# Patient Record
Sex: Female | Born: 1970 | Race: White | Hispanic: No | Smoking: Current every day smoker
Health system: Southern US, Community
[De-identification: ages and names within clinical notes are randomized; demographics above are authoritative.]

## PROBLEM LIST (undated history)

## (undated) DIAGNOSIS — M5136 Other intervertebral disc degeneration, lumbar region: Secondary | ICD-10-CM

## (undated) DIAGNOSIS — IMO0002 Reserved for concepts with insufficient information to code with codable children: Secondary | ICD-10-CM

## (undated) DIAGNOSIS — K859 Acute pancreatitis without necrosis or infection, unspecified: Secondary | ICD-10-CM

## (undated) DIAGNOSIS — G43909 Migraine, unspecified, not intractable, without status migrainosus: Secondary | ICD-10-CM

## (undated) DIAGNOSIS — F319 Bipolar disorder, unspecified: Secondary | ICD-10-CM

## (undated) DIAGNOSIS — J45909 Unspecified asthma, uncomplicated: Secondary | ICD-10-CM

## (undated) DIAGNOSIS — M51369 Other intervertebral disc degeneration, lumbar region without mention of lumbar back pain or lower extremity pain: Secondary | ICD-10-CM

## (undated) DIAGNOSIS — I10 Essential (primary) hypertension: Secondary | ICD-10-CM

## (undated) HISTORY — PX: TUBAL LIGATION: SHX77

## (undated) HISTORY — PX: COLONOSCOPY: SHX174

## (undated) HISTORY — DX: Other intervertebral disc degeneration, lumbar region: M51.36

---

## 1999-07-28 ENCOUNTER — Emergency Department (HOSPITAL_COMMUNITY): Admission: EM | Admit: 1999-07-28 | Discharge: 1999-07-28 | Payer: Self-pay | Admitting: Emergency Medicine

## 2000-10-14 ENCOUNTER — Emergency Department (HOSPITAL_COMMUNITY): Admission: EM | Admit: 2000-10-14 | Discharge: 2000-10-14 | Payer: Self-pay | Admitting: Emergency Medicine

## 2000-10-14 ENCOUNTER — Encounter: Payer: Self-pay | Admitting: Emergency Medicine

## 2002-02-24 ENCOUNTER — Encounter (INDEPENDENT_AMBULATORY_CARE_PROVIDER_SITE_OTHER): Payer: Self-pay | Admitting: *Deleted

## 2002-02-24 ENCOUNTER — Encounter: Admission: RE | Admit: 2002-02-24 | Discharge: 2002-02-24 | Payer: Self-pay | Admitting: *Deleted

## 2002-02-24 ENCOUNTER — Other Ambulatory Visit: Admission: RE | Admit: 2002-02-24 | Discharge: 2002-02-24 | Payer: Self-pay | Admitting: *Deleted

## 2002-12-21 ENCOUNTER — Other Ambulatory Visit: Admission: RE | Admit: 2002-12-21 | Discharge: 2002-12-21 | Payer: Self-pay | Admitting: Obstetrics and Gynecology

## 2003-02-27 ENCOUNTER — Encounter: Admission: RE | Admit: 2003-02-27 | Discharge: 2003-02-27 | Payer: Self-pay | Admitting: Obstetrics and Gynecology

## 2003-02-28 ENCOUNTER — Encounter: Payer: Self-pay | Admitting: Obstetrics and Gynecology

## 2003-02-28 ENCOUNTER — Ambulatory Visit (HOSPITAL_COMMUNITY): Admission: RE | Admit: 2003-02-28 | Discharge: 2003-02-28 | Payer: Self-pay | Admitting: Obstetrics and Gynecology

## 2003-03-14 ENCOUNTER — Inpatient Hospital Stay (HOSPITAL_COMMUNITY): Admission: AD | Admit: 2003-03-14 | Discharge: 2003-03-16 | Payer: Self-pay | Admitting: Obstetrics and Gynecology

## 2003-03-15 ENCOUNTER — Encounter (INDEPENDENT_AMBULATORY_CARE_PROVIDER_SITE_OTHER): Payer: Self-pay | Admitting: *Deleted

## 2005-04-14 ENCOUNTER — Ambulatory Visit: Payer: Self-pay | Admitting: Gastroenterology

## 2005-04-16 ENCOUNTER — Ambulatory Visit: Payer: Self-pay | Admitting: Gastroenterology

## 2005-04-16 ENCOUNTER — Encounter (INDEPENDENT_AMBULATORY_CARE_PROVIDER_SITE_OTHER): Payer: Self-pay | Admitting: *Deleted

## 2005-06-15 ENCOUNTER — Ambulatory Visit: Payer: Self-pay | Admitting: Gastroenterology

## 2007-03-07 ENCOUNTER — Emergency Department (HOSPITAL_COMMUNITY): Admission: EM | Admit: 2007-03-07 | Discharge: 2007-03-07 | Payer: Self-pay | Admitting: Emergency Medicine

## 2007-06-15 ENCOUNTER — Encounter: Admission: RE | Admit: 2007-06-15 | Discharge: 2007-06-15 | Payer: Self-pay | Admitting: Orthopaedic Surgery

## 2007-07-15 ENCOUNTER — Encounter: Admission: RE | Admit: 2007-07-15 | Discharge: 2007-07-15 | Payer: Self-pay | Admitting: Orthopaedic Surgery

## 2009-01-13 ENCOUNTER — Encounter: Admission: RE | Admit: 2009-01-13 | Discharge: 2009-01-13 | Payer: Self-pay | Admitting: Neurological Surgery

## 2010-03-25 ENCOUNTER — Emergency Department (HOSPITAL_COMMUNITY): Admission: EM | Admit: 2010-03-25 | Discharge: 2010-03-25 | Payer: Self-pay | Admitting: Emergency Medicine

## 2011-04-03 NOTE — Discharge Summary (Signed)
   NAMEREEM, FLEURY NO.:  [id number]   MEDICAL RECORD NO.:  [id number]                   PATIENT TYPE:  INP   LOCATION:  9143                                 FACILITY:  [facility]   PHYSICIAN:  Janine Limbo, M.D.            DATE OF BIRTH:  Apr 04, 1971   DATE OF ADMISSION:  03/14/2003  DATE OF DISCHARGE:  03/16/2003                                 DISCHARGE SUMMARY   ADMISSION DIAGNOSES:  1. Intrauterine pregnancy at term.  2. Favorable cervix.  3. Nonreassuring fetal testing with hyperglycemia.  4. Multiparity.  5. Desires bilateral tubal ligation for sterilization.   DISCHARGE DIAGNOSES:  1. Intrauterine pregnancy at term.  2. Favorable cervix.  3. Nonreassuring fetal testing with hyperglycemia.  4. Multiparity.  5. Desires bilateral tubal ligation for sterilization.  6. Breast feeding.   PROCEDURES THIS ADMISSION:  1. Normal spontaneous vaginal delivery of a viable female infant who had     Apgars of 8 and 9 and weighed 6 pounds, 13 ounces, on 03/14/03.  Attended     and delivered by Dr. Jaymes Graff.  2. Bilateral tubal ligation for sterilization on 03/15/03 by Dr. Dois Davenport     Rivard.   HOSPITAL COURSE:  Ms. Anne Hahn is a 40 year old white female, G2, P1-0-0-1, at  term admitted for induction of labor secondary to hyperglycemia with  nonreassuring fetal testing and favorable cervix.  She progressed in labor  to spontaneous vaginal delivery of a viable female infant who weighed 6  pounds, 13 ounces, and had Apgars of 8 and 9 on 03/14/03, attended and  delivered by Dr. Jaymes Graff.  Postpartum, she has done well.  She desired  bilateral tubal ligation and underwent the same on postpartum day #1 by Dr.  Dois Davenport Rivard without difficulty.  She is now ambulating, voiding, and  eating without difficulty.  She is breast feeding also without difficulty.  Her vital signs are stable, and she has remained afebrile throughout her  hospital stay.  She is  deemed ready for discharge today.   DISCHARGE INSTRUCTIONS:  As per the [facility] OB/GYN handout.   DISCHARGE MEDICATIONS:  1. Motrin 600 mg p.o. q.6h. p.r.n. for pain.  2. Tylox 1-2 p.o. q.4-6h. p.r.n. for pain.    DISCHARGE LABORATORIES:  Hemoglobin 11.7, WBC 12.6, platelets 170.   DISCHARGE FOLLOW UP:  In six weeks at [facility] OB[facility]GYN or p.r.n.     Concha Pyo. Duplantis, C.N.M.              Janine Limbo, M.D.    SJD/MEDQ  D:  03/16/2003  T:  03/16/2003  Job:  [id number]

## 2011-04-03 NOTE — H&P (Signed)
NAME:  Melissa Lara, Melissa Lara                       ACCOUNT NO.:  [id number]   MEDICAL RECORD NO.:  [id number]                   PATIENT TYPE:  INP   LOCATION:  9176                                 [facility]ACILITY:  [facility]   PHYSICIAN:  Naima A. Dillard, M.D.              DATE OF BIRTH:  [birth year]   DATE OF ADMISSION:  03/14/2003  DATE OF DISCHARGE:                                HISTORY & PHYSICAL   CHIEF COMPLAINT:  Term 40 weeks, non-reassuring fetal testing with  hyperglycemia.   HISTORY OF PRESENT ILLNESS:  The patient is a 40 year old gravida 1, para 0-  1-0-1, at 40 weeks today, who presented to [place] for NST in which  she was found to have deceleration.  The patient is also known to be  hypoglycemic, an abnormal one hour, never went to a three hour Glucola.  She  has been checking her sugars, fasting and 2 hours post prandial,  and found  to be normal.  The patient denied having any leakage of fluid or vaginal  bleeding.  Fetal movement is good.  The patient has a favorable cervix, and  non-reassuring fetal testing was sent over to labor and delivery for  induction.  Pregnancy has been complicated by abnormal one hour Glucola, the  patient never received a three hour Glucola.  She has been taking her sugars  and they are found to be normal.  Hypothyroidism.  The patient was on  medication.  Her last TSH was found to be normal, and the patient was taken  off of medications per Dr. Lucianne Muss.  She had a history of preterm delivery at  26 weeks with a history of a loop electrosurgical excision procedure of the  cervix.  The patient is now term.   PAST MEDICAL HISTORY:  1. Hypothyroidism.  2. Asthma.  3. Gastric ulcer disease.  4. Depression.   PAST GYNECOLOGICAL HISTORY:  1. The patient has had no abnormal Pap smears.  She had menarche at age 38     occurring every 28 days lasting for 5 to 7 days.  2. The patient did have a loop electrosurgical excision procedure in 5/03.  3.  She had a Pap here in 11/03, which showed ASCUS __________ high-grade     lesion.  The patient refused colposcopy.  4. She had a repeat Pap smear in 2/04, which was normal and negative for     HPV.  The patient should have colposcopy postpartum.   FAMILY HISTORY:  Maternal grandfather with myocardial infarction and  hypertension in her family.   SOCIAL HISTORY:  The patient used to drink a six pack of beer every day.  She has not had any alcohol since a positive pregnancy test.  She also  stopped her Effexor with a positive pregnancy test with depression.  The  patient did smoke 1-1/2 packs per day.  She also quit smoking with positive  pregnancy  test.  The patient denies having any illicit drug use.   REVIEW OF SYMPTOMS:  ENDOCRINE:  Significant for history of hypothyroidism,  abnormal one hour Glucola.  RESPIRATORY:  Asthma.  GASTROINTESTINAL:  History of gastric ulcers.  PSYCHIATRIC:  History of depression.  GENITOURINARY:  As above.   PHYSICAL EXAMINATION:  VITAL SIGNS:  The patient is afebrile with stable  vital signs.  HEART:  Regular rate and rhythm.  LUNGS:  Clear to auscultation bilaterally.  NECK:  Supple with free range of motion.  ABDOMEN:  Gravid, soft, and nontender.  PELVIC:  Her cervix is 4 and 90, -2 station.  EXTREMITIES:  Trace edema, no cyanosis, clubbing, or edema.   PLAN:  The patient was admitted to the hospital, started with Pitocin  augmentation and also AROM.  Noted to be clear fluid.  An IUPC was placed.  We will titrate Pitocin to keep __________ adequate.  Anticipate a vaginal  delivery.  We will check blood sugar and give epidural for pain.                                               Naima A. Normand Sloop, M.D.    NAD/MEDQ  D:  03/14/2003  T:  03/14/2003  Job:  [id number]

## 2011-04-03 NOTE — Op Note (Signed)
   NAME:  Melissa Lara, Melissa Lara                       ACCOUNT NO.:  [id number]   MEDICAL RECORD NO.:  [id number]                   PATIENT TYPE:  INP   LOCATION:  9143                                 [facility]ILITY:  [facility]   PHYSICIAN:  Crist Fat. Rivard, M.D.              DATE OF BIRTH:  01-29-1971   DATE OF PROCEDURE:  DATE OF DISCHARGE:                                 OPERATIVE REPORT   PREOPERATIVE DIAGNOSIS:  Desire for sterilization.   POSTOPERATIVE DIAGNOSIS:  Desire for sterilization.   PROCEDURE:  Postpartum bilateral tubal ligation.   ANESTHESIA:  Epidural.   SURGEON:  Crist Fat. Rivard, M.D.   ESTIMATED BLOOD LOSS:  Minimal.   DESCRIPTION OF PROCEDURE:  After being informed of the planned procedure  with possible complications including bleeding, infection, injury to other  organs, irreversibility and failure rates of one in 500 to one in 1000,  informed consent was obtained.  The patient was taken to OR #4 and was given  epidural anesthesia via the previously-placed epidural catheter.  After  assessing adequate level of anesthesia, we proceeded with infiltration of  the umbilical area with Marcaine 0.25% 10 mL and performed a semi-elliptical  incision, which was brought down bluntly to the fascia.  Fascia was grasped  with two Allis forceps and incised with Mayo scissors.  Peritoneum was then  entered bluntly.  We were able to easily locate both tubes to the fimbrial  end, cauterize a small incision in the mesosalpinx, doubly ligate each stump  of each tube, and remove a portion of 1.5 cm of tube.  Each stump was then  cauterized.  Hemostasis was adequate.  We proceeded with closure of the  fascia with a running locked suture of 0 Vicryl and closure of the skin with  a subcuticular suture of 4-0 Monocryl and Steri-Strips.   Instrument and sponge count was complete x2.  Estimated blood loss is  minimal.  The procedure is well-tolerated by the patient, who is taken to  the  recovery room in a well and stable condition.                                               Crist Fat Rivard, M.D.    SAR/MEDQ  D:  03/15/2003  T:  03/15/2003  Job:  [id number]

## 2012-08-21 ENCOUNTER — Observation Stay (HOSPITAL_COMMUNITY)
Admission: EM | Admit: 2012-08-21 | Discharge: 2012-08-22 | Disposition: A | Discharge status: Home / Self Care | Payer: Self-pay | Attending: Emergency Medicine | Admitting: Emergency Medicine

## 2012-08-21 ENCOUNTER — Other Ambulatory Visit: Payer: Self-pay

## 2012-08-21 ENCOUNTER — Encounter (HOSPITAL_COMMUNITY): Payer: Self-pay | Admitting: Emergency Medicine

## 2012-08-21 DIAGNOSIS — J45909 Unspecified asthma, uncomplicated: Secondary | ICD-10-CM | POA: Insufficient documentation

## 2012-08-21 DIAGNOSIS — F319 Bipolar disorder, unspecified: Secondary | ICD-10-CM | POA: Insufficient documentation

## 2012-08-21 DIAGNOSIS — R079 Chest pain, unspecified: Principal | ICD-10-CM | POA: Insufficient documentation

## 2012-08-21 DIAGNOSIS — E119 Type 2 diabetes mellitus without complications: Secondary | ICD-10-CM | POA: Insufficient documentation

## 2012-08-21 DIAGNOSIS — F17200 Nicotine dependence, unspecified, uncomplicated: Secondary | ICD-10-CM | POA: Insufficient documentation

## 2012-08-21 DIAGNOSIS — R0602 Shortness of breath: Secondary | ICD-10-CM | POA: Insufficient documentation

## 2012-08-21 HISTORY — DX: Reserved for concepts with insufficient information to code with codable children: IMO0002

## 2012-08-21 HISTORY — DX: Unspecified asthma, uncomplicated: J45.909

## 2012-08-21 HISTORY — DX: Bipolar disorder, unspecified: F31.9

## 2012-08-21 NOTE — ED Notes (Signed)
PT. REPORTS INTERMITTENT MID CHEST PAIN  ONSET 4 DAYS AGO WORSE THIS EVENING WITH SOB AND OCCASIONAL DRY COUGH AND DIAPHORESIS . NO NAUSEA. STATES HISTORY OF ASTHMA.

## 2012-08-22 ENCOUNTER — Emergency Department (HOSPITAL_COMMUNITY): Payer: Self-pay

## 2012-08-22 DIAGNOSIS — R079 Chest pain, unspecified: Secondary | ICD-10-CM

## 2012-08-22 LAB — POCT I-STAT TROPONIN I: Troponin i, poc: 0 ng/mL (ref 0.00–0.08)

## 2012-08-22 LAB — COMPREHENSIVE METABOLIC PANEL
ALT: 15 U/L (ref 0–35)
Alkaline Phosphatase: 112 U/L (ref 39–117)
CO2: 26 mEq/L (ref 19–32)
Chloride: 100 mEq/L (ref 96–112)
GFR calc Af Amer: >90 mL/min (ref >90)
GFR calc non Af Amer: 87 mL/min — ABNORMAL LOW (ref >90)
Glucose, Bld: 334 mg/dL — ABNORMAL HIGH (ref 70–99)
Potassium: 4.4 mEq/L (ref 3.5–5.1)
Sodium: 137 mEq/L (ref 135–145)
Total Protein: 6.7 g/dL (ref 6.0–8.3)

## 2012-08-22 LAB — CBC WITH DIFFERENTIAL/PLATELET
Basophils Absolute: 0 10*3/uL (ref 0.0–0.1)
Eosinophils Relative: 1 % (ref 0–5)
Lymphocytes Relative: 30 % (ref 12–46)
Neutro Abs: 6.9 10*3/uL (ref 1.7–7.7)
Neutrophils Relative %: 62 % (ref 43–77)
Platelets: 224 10*3/uL (ref 150–400)
RDW: 12.9 % (ref 11.5–15.5)
WBC: 11.2 10*3/uL — ABNORMAL HIGH (ref 4.0–10.5)

## 2012-08-22 LAB — TROPONIN I: Troponin I: <0.3 ng/mL (ref <0.30)

## 2012-08-22 MED ORDER — ACETAMINOPHEN 325 MG PO TABS
650.0000 mg | ORAL_TABLET | Freq: Once | ORAL | Status: AC
  Administered 2012-08-22: 650 mg via ORAL
  Filled 2012-08-22: qty 2

## 2012-08-22 MED ORDER — MORPHINE SULFATE 4 MG/ML IJ SOLN
4.0000 mg | Freq: Once | INTRAMUSCULAR | Status: AC
  Administered 2012-08-22: 4 mg via INTRAVENOUS
  Filled 2012-08-22: qty 1

## 2012-08-22 MED ORDER — NITROGLYCERIN 0.4 MG SL SUBL
0.4000 mg | SUBLINGUAL_TABLET | SUBLINGUAL | Status: AC | PRN
  Administered 2012-08-22 (×3): 0.4 mg via SUBLINGUAL
  Filled 2012-08-22 (×2): qty 25

## 2012-08-22 MED ORDER — PANTOPRAZOLE SODIUM 40 MG IV SOLR
40.0000 mg | Freq: Once | INTRAVENOUS | Status: AC
  Administered 2012-08-22: 40 mg via INTRAVENOUS
  Filled 2012-08-22: qty 40

## 2012-08-22 MED ORDER — ASPIRIN 81 MG PO CHEW
324.0000 mg | CHEWABLE_TABLET | Freq: Once | ORAL | Status: AC
  Administered 2012-08-22: 324 mg via ORAL
  Filled 2012-08-22: qty 4

## 2012-08-22 MED ORDER — ONDANSETRON HCL 4 MG/2ML IJ SOLN
4.0000 mg | Freq: Once | INTRAMUSCULAR | Status: AC
  Administered 2012-08-22: 4 mg via INTRAVENOUS
  Filled 2012-08-22: qty 2

## 2012-08-22 NOTE — ED Notes (Signed)
Pt is back in room from the restroom

## 2012-08-22 NOTE — ED Notes (Signed)
Pt states that she has been noticing CP when she has been walking her dogs, or exercetion. Pt states that she thought it was anxiety and when she tells herself to calm down her pain would decrease too. Pt states that today her pain went to her jaw, and she was worried about it because it was worse than the past couple of days. Pt states that while back in bed her pain went from 3/10 to a 1/10 after she took a couple of breaths and relaxed. Pt alert and oriented and denies any cardiac history.

## 2012-08-22 NOTE — ED Notes (Signed)
Family at bedside. 

## 2012-08-22 NOTE — Progress Notes (Signed)
  Echocardiogram Echocardiogram Stress Test has been performed.  Melissa Lara 08/22/2012, 10:57 AM

## 2012-08-22 NOTE — ED Provider Notes (Signed)
History     CSN: [id number]  Arrival date & time 08/21/12  2327   First MD Initiated Contact with Patient 08/22/12 0033      Chief Complaint  Patient presents with  . Chest Pain    (Consider location/radiation/quality/duration/timing/severity/associated sxs/prior treatment) HPI Hx per PT. Bilateral CP started 2 days ago, worse with exertion, improved with rest, mild SOB. Pain radiates to jaw. Now is 1/10. Mod in severity at worst. No h/o same. No wheezing, no cough, no fevers, no leg pain or swelling. Is a smoker. mother with CAD stent age 6.  Past Medical History  Diagnosis Date  . Asthma   . Diabetes mellitus   . DDD (degenerative disc disease)   . Bipolar 1 disorder     Past Surgical History  Procedure Date  . Tubal ligation   . Colonoscopy     No family history on file.  History  Substance Use Topics  . Smoking status: Current Every Day Smoker  . Smokeless tobacco: Not on file  . Alcohol Use: Yes    OB History    Grav Para Term Preterm Abortions TAB SAB Ect Mult Living                  Review of Systems  Constitutional: Negative for fever and chills.  HENT: Negative for neck pain and neck stiffness.   Eyes: Negative for pain.  Respiratory: Positive for shortness of breath.   Cardiovascular: Positive for chest pain.  Gastrointestinal: Negative for abdominal pain.  Genitourinary: Negative for dysuria.  Musculoskeletal: Negative for back pain.  Skin: Negative for rash.  Neurological: Negative for headaches.  All other systems reviewed and are negative.    Allergies  Benadryl  Home Medications   Current Outpatient Rx  Name Route Sig Dispense Refill  . ALBUTEROL SULFATE HFA 108 (90 BASE) MCG/ACT IN AERS Inhalation Inhale 2 puffs into the lungs every 6 (six) hours as needed. For shortness of breath      BP 156/93  Pulse 64  Temp 98.3 F (36.8 C) (Oral)  Resp 18  SpO2 96%  LMP 07/29/2012  Physical Exam  Constitutional: She is oriented to  person, place, and time. She appears well-developed and well-nourished.  HENT:  Head: Normocephalic and atraumatic.  Eyes: Conjunctivae normal and EOM are normal. Pupils are equal, round, and reactive to light.  Neck: Trachea normal. Neck supple. No thyromegaly present.  Cardiovascular: Normal rate, regular rhythm, S1 normal, S2 normal and normal pulses.     No systolic murmur is present   No diastolic murmur is present  Pulses:      Radial pulses are 2+ on the right side, and 2+ on the left side.  Pulmonary/Chest: Effort normal and breath sounds normal. She has no wheezes. She has no rhonchi. She has no rales. She exhibits no tenderness.  Abdominal: Soft. Normal appearance and bowel sounds are normal. There is no tenderness. There is no CVA tenderness and negative Murphy's sign.  Musculoskeletal:       BLE:s Calves nontender, no cords or erythema, negative Homans sign  Neurological: She is alert and oriented to person, place, and time. She has normal strength. No cranial nerve deficit or sensory deficit. GCS eye subscore is 4. GCS verbal subscore is 5. GCS motor subscore is 6.  Skin: Skin is warm and dry. No rash noted. She is not diaphoretic.  Psychiatric: Her speech is normal.       Cooperative and appropriate  ED Course  Procedures (including critical care time)  Results for orders placed during the hospital encounter of 08/21/12  COMPREHENSIVE METABOLIC PANEL      Component Value Range   Sodium 137  135 - 145 mEq/L   Potassium 4.4  3.5 - 5.1 mEq/L   Chloride 100  96 - 112 mEq/L   CO2 26  19 - 32 mEq/L   Glucose, Bld 334 (*) 70 - 99 mg/dL   BUN 16  6 - 23 mg/dL   Creatinine, Ser 1.61  0.50 - 1.10 mg/dL   Calcium 9.6  8.4 - 09.6 mg/dL   Total Protein 6.7  6.0 - 8.3 g/dL   Albumin 3.6  3.5 - 5.2 g/dL   AST 16  0 - 37 U/L   ALT 15  0 - 35 U/L   Alkaline Phosphatase 112  39 - 117 U/L   Total Bilirubin 0.1 (*) 0.3 - 1.2 mg/dL   GFR calc non Af Amer 87 (*) >90 mL/min   GFR  calc Af Amer >90  >90 mL/min  CBC WITH DIFFERENTIAL      Component Value Range   WBC 11.2 (*) 4.0 - 10.5 K/uL   RBC 5.17 (*) 3.87 - 5.11 MIL/uL   Hemoglobin 15.6 (*) 12.0 - 15.0 g/dL   HCT 04.5  40.9 - 81.1 %   MCV 84.5  78.0 - 100.0 fL   MCH 30.2  26.0 - 34.0 pg   MCHC 35.7  30.0 - 36.0 g/dL   RDW 91.4  78.2 - 95.6 %   Platelets 224  150 - 400 K/uL   Neutrophils Relative 62  43 - 77 %   Neutro Abs 6.9  1.7 - 7.7 K/uL   Lymphocytes Relative 30  12 - 46 %   Lymphs Abs 3.4  0.7 - 4.0 K/uL   Monocytes Relative 7  3 - 12 %   Monocytes Absolute 0.8  0.1 - 1.0 K/uL   Eosinophils Relative 1  0 - 5 %   Eosinophils Absolute 0.1  0.0 - 0.7 K/uL   Basophils Relative 0  0 - 1 %   Basophils Absolute 0.0  0.0 - 0.1 K/uL  POCT I-STAT TROPONIN I      Component Value Range   Troponin i, poc 0.00  0.00 - 0.08 ng/mL   Comment 3            Dg Chest 2 View  08/22/2012  *RADIOLOGY REPORT*  Clinical Data: 41 year old female bilateral chest pain.  Jaw pain.  CHEST - 2 VIEW  Comparison: None.  Findings: Lung volumes are within normal limits.  Cardiac size and mediastinal contours are within normal limits.  Visualized tracheal air column is within normal limits.  No pneumothorax, pulmonary edema, pleural effusion or confluent pulmonary opacity. Degenerative changes in the spine. No acute osseous abnormality identified.  IMPRESSION: No acute cardiopulmonary abnormality.   Original Report Authenticated By: Harley Hallmark, M.D.      Date: 08/22/2012  Rate: 61  Rhythm: normal sinus rhythm  QRS Axis: normal  Intervals: normal  ST/T Wave abnormalities: nonspecific ST changes  Conduction Disutrbances:none  Narrative Interpretation: low voltage  Old EKG Reviewed: none available  ASA. NTG. Stat ECG. CP concerning for ACS with some risk factors. Labs and CXR reviewed as above. Pain improved on recheck.   Wt 220 / Ht 5'6''  BMI 36.6 - Plan CDU CP protocol with scheduled stress test in am  MDM   41  yo  female with exertional CP. Is diabetic and smoker and her mother had stent age 4.          Sunnie Nielsen, MD 08/22/12 [phone]

## 2012-08-22 NOTE — ED Notes (Signed)
Pt ambulated to restroom. 

## 2012-08-22 NOTE — ED Notes (Signed)
Pt c/o of chest pain at a 5

## 2012-08-22 NOTE — ED Notes (Signed)
Pt requested med for heartburn/ notified EDP/ received telephone order from EDP Opitz to give pt 40mg  of protonix  IV once intravenous. Verbal readback order.

## 2012-08-22 NOTE — ED Notes (Addendum)
Pt having slight jaw pain same as before but feels SOB, pt taking deep slow breaths. Pt placed on 2L O2 for comfort. Spoke with CDU. Pt aware of delay and that painfree before going to CDU.

## 2012-08-22 NOTE — ED Notes (Signed)
Reassessed pt chest pain/ pt states it is at a 0

## 2012-08-22 NOTE — ED Provider Notes (Signed)
7:00 AM Assumed care of patient in the CDU.  Patient is currently on the Chest Pain Protocol.  Patient presented to the ED last evening with two day of chest pain that was worse with exertion and better with rest.  Chest pain radiated to the jaw.  PMH history significant for smoking and DM.  Her mother had a cardiac stent at the age of 40 yo.  No acute findings on CXR.  Initial troponin negative.  Non specific ST changes on EKG.  Plan is for the patient to have a Stress Echo this morning.  Reassessed patient.  She denies any chest pain at this time.   Patient alert and orientated x 3 Heart:  RRR Lungs:  CTAB Extremities:  No LE edema, DP pulse 2+ bilaterally  1:00 PM Results of the patient's Stress Echo were non diagnostic.  Patient was unable to obtain target heart rate.  Discussed results with Dr. Weldon Inches.  Patient is not having any chest pain at this time.  Will discharge patient home with Cardiology follow up.  Return precautions discussed.  Pascal Lux [place], PA-C 08/22/12 1705

## 2012-09-03 NOTE — ED Provider Notes (Signed)
Medical screening examination/treatment/procedure(s) were performed by non-physician practitioner and as supervising physician I was immediately available for consultation/collaboration.  Dayvon Dax, MD 09/03/12 1525 

## 2014-01-14 ENCOUNTER — Encounter (HOSPITAL_COMMUNITY): Payer: Self-pay | Admitting: Emergency Medicine

## 2014-01-14 ENCOUNTER — Emergency Department (HOSPITAL_COMMUNITY)
Admission: EM | Admit: 2014-01-14 | Discharge: 2014-01-14 | Disposition: A | Discharge status: Home / Self Care | Payer: Self-pay | Attending: Emergency Medicine | Admitting: Emergency Medicine

## 2014-01-14 DIAGNOSIS — J45909 Unspecified asthma, uncomplicated: Secondary | ICD-10-CM | POA: Insufficient documentation

## 2014-01-14 DIAGNOSIS — IMO0002 Reserved for concepts with insufficient information to code with codable children: Secondary | ICD-10-CM | POA: Insufficient documentation

## 2014-01-14 DIAGNOSIS — M549 Dorsalgia, unspecified: Secondary | ICD-10-CM

## 2014-01-14 DIAGNOSIS — E119 Type 2 diabetes mellitus without complications: Secondary | ICD-10-CM | POA: Insufficient documentation

## 2014-01-14 DIAGNOSIS — F17200 Nicotine dependence, unspecified, uncomplicated: Secondary | ICD-10-CM | POA: Insufficient documentation

## 2014-01-14 DIAGNOSIS — Z8659 Personal history of other mental and behavioral disorders: Secondary | ICD-10-CM | POA: Insufficient documentation

## 2014-01-14 DIAGNOSIS — M546 Pain in thoracic spine: Secondary | ICD-10-CM | POA: Insufficient documentation

## 2014-01-14 MED ORDER — IBUPROFEN 800 MG PO TABS
800.0000 mg | ORAL_TABLET | Freq: Three times a day (TID) | ORAL | Status: AC | PRN
Start: 2014-01-14 — End: ?

## 2014-01-14 MED ORDER — DIAZEPAM 5 MG PO TABS: 5.0000 mg | ORAL_TABLET | Freq: Three times a day (TID) | ORAL | Status: AC | PRN

## 2014-01-14 NOTE — ED Notes (Addendum)
Pt from home reports L side mid back pain that radiates up to L shoulder. Pt has hx of DDD from L5-S1. Pt denies injury, only raking a few days ago. Pt friend gave her Robaxin and had no relief. Pt is A&O and in NAD. Pt denies urinary s/sx

## 2014-01-14 NOTE — Discharge Instructions (Signed)
Read the information below.  Use the prescribed medication as directed.  Please discuss all new medications with your pharmacist.  You may return to the Emergency Department at any time for worsening condition or any new symptoms that concern you.   If you develop fevers, loss of control of bowel or bladder, weakness or numbness in your legs, or are unable to walk, return to the ER for a recheck.    Back Exercises Back exercises help treat and prevent back injuries. The goal of back exercises is to increase the strength of your abdominal and back muscles and the flexibility of your back. These exercises should be started when you no longer have back pain. Back exercises include:  Pelvic Tilt. Lie on your back with your knees bent. Tilt your pelvis until the lower part of your back is against the floor. Hold this position 5 to 10 sec and repeat 5 to 10 times.  Knee to Chest. Pull first 1 knee up against your chest and hold for 20 to 30 seconds, repeat this with the other knee, and then both knees. This may be done with the other leg straight or bent, whichever feels better.  Sit-Ups or Curl-Ups. Bend your knees 90 degrees. Start with tilting your pelvis, and do a partial, slow sit-up, lifting your trunk only 30 to 45 degrees off the floor. Take at least 2 to 3 seconds for each sit-up. Do not do sit-ups with your knees out straight. If partial sit-ups are difficult, simply do the above but with only tightening your abdominal muscles and holding it as directed.  Hip-Lift. Lie on your back with your knees flexed 90 degrees. Push down with your feet and shoulders as you raise your hips a couple inches off the floor; hold for 10 seconds, repeat 5 to 10 times.  Back arches. Lie on your stomach, propping yourself up on bent elbows. Slowly press on your hands, causing an arch in your low back. Repeat 3 to 5 times. Any initial stiffness and discomfort should lessen with repetition over time.  Shoulder-Lifts.  Lie face down with arms beside your body. Keep hips and torso pressed to floor as you slowly lift your head and shoulders off the floor. Do not overdo your exercises, especially in the beginning. Exercises may cause you some mild back discomfort which lasts for a few minutes; however, if the pain is more severe, or lasts for more than 15 minutes, do not continue exercises until you see your caregiver. Improvement with exercise therapy for back problems is slow.  See your caregivers for assistance with developing a proper back exercise program. Document Released: 12/10/2004 Document Revised: 01/25/2012 Document Reviewed: 09/03/2011 [facility] Patient Information 2014 [place], [place].  Back Pain, Adult Low back pain is very common. About 1 in 5 people have back pain.The cause of low back pain is rarely dangerous. The pain often gets better over time.About half of people with a sudden onset of back pain feel better in just 2 weeks. About 8 in 10 people feel better by 6 weeks.  CAUSES Some common causes of back pain include:  Strain of the muscles or ligaments supporting the spine.  Wear and tear (degeneration) of the spinal discs.  Arthritis.  Direct injury to the back. DIAGNOSIS Most of the time, the direct cause of low back pain is not known.However, back pain can be treated effectively even when the exact cause of the pain is unknown.Answering your caregiver's questions about your overall health and  symptoms is one of the most accurate ways to make sure the cause of your pain is not dangerous. If your caregiver needs more information, he or she may order lab work or imaging tests (X-rays or MRIs).However, even if imaging tests show changes in your back, this usually does not require surgery. HOME CARE INSTRUCTIONS For many people, back pain returns.Since low back pain is rarely dangerous, it is often a condition that people can learn to [facility] their own.   Remain active. It is stressful  on the back to sit or stand in one place. Do not sit, drive, or stand in one place for more than 30 minutes at a time. Take short walks on level surfaces as soon as pain allows.Try to increase the length of time you walk each day.  Do not stay in bed.Resting more than 1 or 2 days can delay your recovery.  Do not avoid exercise or work.Your body is made to move.It is not dangerous to be active, even though your back may hurt.Your back will likely heal faster if you return to being active before your pain is gone.  Pay attention to your body when you bend and lift. Many people have less discomfortwhen lifting if they bend their knees, keep the load close to their bodies,and avoid twisting. Often, the most comfortable positions are those that put less stress on your recovering back.  Find a comfortable position to sleep. Use a firm mattress and lie on your side with your knees slightly bent. If you lie on your back, put a pillow under your knees.  Only take over-the-counter or prescription medicines as directed by your caregiver. Over-the-counter medicines to reduce pain and inflammation are often the most helpful.Your caregiver may prescribe muscle relaxant drugs.These medicines help dull your pain so you can more quickly return to your normal activities and healthy exercise.  Put ice on the injured area.  Put ice in a plastic bag.  Place a towel between your skin and the bag.  Leave the ice on for 15-20 minutes, 03-04 times a day for the first 2 to 3 days. After that, ice and heat may be alternated to reduce pain and spasms.  Ask your caregiver about trying back exercises and gentle massage. This may be of some benefit.  Avoid feeling anxious or stressed.Stress increases muscle tension and can worsen back pain.It is important to recognize when you are anxious or stressed and learn ways to manage it.Exercise is a great option. SEEK MEDICAL CARE IF:  You have pain that is not  relieved with rest or medicine.  You have pain that does not improve in 1 week.  You have new symptoms.  You are generally not feeling well. SEEK IMMEDIATE MEDICAL CARE IF:   You have pain that radiates from your back into your legs.  You develop new bowel or bladder control problems.  You have unusual weakness or numbness in your arms or legs.  You develop nausea or vomiting.  You develop abdominal pain.  You feel faint. Document Released: 11/02/2005 Document Revised: 05/03/2012 Document Reviewed: 03/23/2011 [facility] Patient Information 2014 [place], [place].    Emergency Department Resource Guide 1) Find a Doctor and Pay Out of Pocket Although you won't have to find out who is covered by your insurance plan, it is a good idea to ask around and get recommendations. You will then need to call the office and see if the doctor you have chosen will accept you as a new patient  and what types of options they offer for patients who are self-pay. Some doctors offer discounts or will set up payment plans for their patients who do not have insurance, but you will need to ask so you aren't surprised when you get to your appointment.  2) Contact Your Local Health Department Not all health departments have doctors that can see patients for sick visits, but many do, so it is worth a call to see if yours does. If you don't know where your local health department is, you can check in your phone book. The CDC also has a tool to help you locate your state's health department, and many state websites also have listings of all of their local health departments.  3) Find a Walk-in Clinic If your illness is not likely to be very severe or complicated, you may want to try a walk in clinic. These are popping up all over the country in pharmacies, drugstores, and shopping centers. They're usually staffed by nurse practitioners or physician assistants that have been trained to treat common illnesses and  complaints. They're usually fairly quick and inexpensive. However, if you have serious medical issues or chronic medical problems, these are probably not your best option.  No Primary Care Doctor: - Call Health Connect at  [phone] - they can help you locate a primary care doctor that  accepts your insurance, provides certain services, etc. - Physician Referral Service- [phone]  Chronic Pain Problems: Organization         Address  Phone   Notes  [facility]  [phone] Patients need to be referred by their primary care doctor.   Medication Assistance: Organization         Address  Phone   Notes  [facility] [address] [place], [place] [address] [phone] --Must be a resident of [facility] -- Must have NO insurance coverage whatsoever (no Medicaid/ Medicare, etc.) -- The pt. MUST have a primary care doctor that directs their care regularly and follows them in the community   [facility]  [phone]   [employer]  [phone]    Agencies that provide inexpensive medical care: Organization         Address  Phone   Notes  [facility]  [phone]   [facility]    [phone]   [facility] [address] [place], [place] [address] [phone]   [facility] [address] [place] [phone]   Planned Parenthood    [phone]   [facility]    [phone]   [facility]  [address], [place] Phone:  [phone], Fax:  [phone] Hours of Operation:  9 am - 6 pm, M-F.  Also accepts Medicaid/Medicare and self-pay.  [facility]  [address], [place] Phone: [phone], Fax: [phone]. Hours of Operation:  8:30 am - 5:30 pm, M-F.  Also accepts Medicaid and self-pay.  [facility] [place] [address], [place] Phone: [phone]   [facility] [address] Natasha Bence [place], [place] [phone], Ext. [phone] Mondays & Thursdays: 7-9 AM.  First 15 patients are seen on a first come, first serve basis.    Medicaid-accepting [facility] Providers:  Retail buyer  Notes  [facility] [address], [place] [phone] Also accepts self-pay patients.  [facility] [address] Laurell Josephs [place], [place]  [phone]   [facility] [address], [place] [phone]   [facility] [address], [place] [phone]   Renaye Rakers [address], [place]   [phone] Only accepts [place] Access [place] patients after they have their name applied to their card.   Self-Pay (no insurance) in [facility]:  Organization         Address  Phone   Notes  Sickle Cell Patients, [facility] Internal Medicine [address] [place], [place] [phone]   [facility] [address] [place], [place] [phone]   [facility] [place]  [address], [place] [phone]   [facility]/Dr. Osei-Bonsu  [address] [place] or [address], [place] [phone] Phone number for both [place] and [place] locations is the same.  [facility] [address] [place] [phone]   [facility] [address], [place] or [address] [phone]   [facility] [address] [place] [phone], phone; [phone], fax Sees patients 1st and 3rd Saturday of every month.  Must not qualify for public or private insurance (i.e. Medicaid, Medicare, [place] Health Choice, Veterans' Benefits)  Household income should be no more than 200% of the poverty level  The clinic cannot treat you if you are pregnant or think you are pregnant  Sexually transmitted diseases are not treated at the clinic.    Dental Care: Organization         Address  Phone  Notes  [facility] Department of [facility] [address] [place], [place] [phone] Accepts children up to age 68 who are enrolled in [place] or [place] Health Choice; pregnant women with a Medicaid card; and children who have applied for Medicaid or [place] Health Choice, but were declined, whose parents can pay a reduced fee at time of service.  [facility]  [address], [place] [phone] Accepts children up to age 84 who are enrolled in [place] or [place] Health Choice; pregnant women with a Medicaid card; and children who have applied for Medicaid or  Health Choice, but were declined, whose parents can pay a reduced fee at time of service.  [facility]  [address] [place], [place] [phone] Patients are seen by appointment only. Walk-ins are not accepted. [facility] will see patients 45 years of age and older. Monday - Tuesday (8am-5pm) Most Wednesdays (8:30-5pm) $30 per visit, cash only  [facility] Adult [facility]  [address], [facility] [phone] Patients are seen by appointment only. Walk-ins are not accepted. [facility] will see patients 34 years of age and older. One Wednesday Evening (Monthly: Volunteer Based).  $30 per visit, cash only  [employer]  [phone] for adults; Children under age 63, call Graduate Pediatric Dentistry at [phone]. Children aged 68-14, please call [phone] to request a pediatric application.  Dental services are provided in all areas of dental care including fillings, crowns and bridges, complete and  partial dentures, implants, gum treatment, root canals, and extractions. Preventive care is  also provided. Treatment is provided to both adults and children. Patients are selected via a lottery and there is often a waiting list.   [facility] [address] [place]  [phone] [email or web address]   [facility] [address] [place], [place] [phone], Ext. [phone] Second and Fourth Thursday of each month, opens at 6:30 AM; Clinic ends at 9 AM.  Patients are seen on a first-come first-served basis, and a limited number are seen during each clinic.   [facility]  [address] Ether Griffins [place], [place] [phone]   Eligibility Requirements You must have lived in [place], [place], or [place] counties for at least the last three months.   You cannot be eligible for state or federal sponsored [place], including [employer], [place], or [employer].   You generally cannot be eligible for healthcare insurance through your employer.    How to apply: Eligibility screenings are held every Tuesday and Wednesday afternoon from 1:00 pm until 4:00 pm. You do not need an appointment for the interview!  [facility] [address] [place], [place] [phone]   [facility] Health Department  [phone]   [facility] Health Department  [phone]   [facility] Health Department  [phone]    Behavioral Health Resources in the Community: Intensive Outpatient Programs Organization         Address  Phone  Notes  [facility] Services [address], [place], [place] [phone]   [facility] Outpatient [address] [place], [place] [phone]   [facility] [address], [place], [place]  [phone]   [facility] Mental Health [address]  [place], [place] [phone] or [phone]   Substance Abuse Resources Organization         Address  Phone  Notes  [facility]  [phone]   [facility]  [phone]   [facility] [place]  [phone]   Floydene Flock  [phone]   Residential & Outpatient Substance Abuse Program  [phone]   Psychological Services Organization         Address  Phone  Notes  [facility] Behavioral Health  [phone]   [facility] Services  [phone]   [facility] Mental Health [address] [place] [phone] or [phone]    Mobile Crisis Teams Organization         Address  Phone  Notes  [facility], Mobile Crisis Care Unit  [phone]   [facility]  [address] [place], [place] [phone]   Doristine Locks [address] [place] [phone]    Self-Help/Support Groups Organization         Address  Phone             Notes  [facility] [place] - variety of support groups  336- I7437963 Call for more information  Narcotics Anonymous (NA), [facility] [address], [employer] [place]  2 meetings at this location   Statistician         Address  Phone  Notes  [facility] [address],    [place]  [phone]   [facility]  [address] [place] [address], [place], [place] [phone]   [facility] [address] [place], [place] [phone] Admissions: 8am-3pm M-F  [facility] [address],    [place], [place] [phone]   [facility] [address] [place], [place], [place] [phone]   [facility] [address]  [place], [place] [phone]   [facility] - Intensive Outpatient [address]ure[address], [place], [place] [phone]   [facility] ([facility] [address] [place]  [place], [place] [phone] or [phone]   [facility] ([facility]) [address] [place], [place] [phone] Accepts Medicaid  [facility] [address]  [place] [phone]  Substance Abuse/Addiction Treatment   [facility] Organization         Address  Phone  Notes  [facility]  [phone]   Angie Fava, PhD [address] [place]vin Kn[address] [place], [place]   [phone] or [phone]   [facility] Behavioral   [address] [place], [place] [phone]   [facility] [address] [place], [place] [phone] Insurance/Medicaid/sponsorship through [facility] and Families [address]                                    [place], [place] [phone] Therapy/tele-psych/case  [facility] [address], [place] [phone]    Dr. Lolly Mustache  [phone]   [facility]  [employer] [facility] Dept. 1) [address] [place] 2) [address], [place] 3)  [address], [place] [phone]  [phone]   [facility] Child Abuse Hotline 706 747 6540 or [phone] (After Hours)

## 2014-01-14 NOTE — ED Notes (Signed)
Pt states that she does not have HTN, only when she is in pain.

## 2014-01-14 NOTE — ED Notes (Signed)
Offered pt a Toradol injection per PA prior to d/c. Pt refused, stating she just wants to "get prescriptions filled." Pt was advised to have BP check with PCP this week. Pt verbalized understanding and agreed that she would.

## 2014-01-14 NOTE — ED Notes (Signed)
PA at bedside.

## 2014-01-14 NOTE — ED Provider Notes (Signed)
CSN: [id number]     Arrival date & time 01/14/14  [id number] History   First MD Initiated Contact with Patient 01/14/14 [phone]     Chief Complaint  Patient presents with  . Back Pain     (Consider location/radiation/quality/duration/timing/severity/associated sxs/prior Treatment) HPI Patient reports 5 days of constant left upper back pain that began while raking leaves in her back yard.  Reports leaves were wet and it was difficult to move them, states she was raking for approximately 10 minutes.  Has since had constant pain, worse with palpation and with movement. The pain has not changed or moved and does not radiate. Has taken ibuprofen and her mother's robaxin without improvement.  Denies fevers, CP, SOB, cough, loss of control of bowel or bladder, weakness or numbness of the extremities, saddle anesthesia.  Has hx DDD, has been off medications for three years.   Past Medical History  Diagnosis Date  . Asthma   . Diabetes mellitus   . DDD (degenerative disc disease)   . Bipolar 1 disorder    Past Surgical History  Procedure Laterality Date  . Tubal ligation    . Colonoscopy     No family history on file. History  Substance Use Topics  . Smoking status: Current Every Day Smoker  . Smokeless tobacco: Not on file  . Alcohol Use: Yes   OB History   Grav Para Term Preterm Abortions TAB SAB Ect Mult Living                 Review of Systems  Constitutional: Negative for fever.  Respiratory: Negative for cough and shortness of breath.   Cardiovascular: Negative for chest pain.  Gastrointestinal: Negative for abdominal pain.  Genitourinary: Negative for dysuria, urgency and frequency.  Musculoskeletal: Positive for back pain. Negative for gait problem.  Skin: Negative for rash.  Neurological: Negative for weakness and numbness.  All other systems reviewed and are negative.      Allergies  Benadryl  Home Medications   Current Outpatient Rx  Name  Route  Sig  Dispense   Refill  . ibuprofen (ADVIL,MOTRIN) 200 MG tablet   Oral   Take 600 mg by mouth every 6 (six) hours as needed for moderate pain.         . diazepam (VALIUM) 5 MG tablet   Oral   Take 1 tablet (5 mg total) by mouth every 8 (eight) hours as needed (muscle spasm or pain).   10 tablet   0   . ibuprofen (ADVIL,MOTRIN) 800 MG tablet   Oral   Take 1 tablet (800 mg total) by mouth every 8 (eight) hours as needed for mild pain or moderate pain.   15 tablet   0    BP 160/96  Pulse 80  Temp(Src) 97.7 F (36.5 C) (Oral)  Resp 20  SpO2 97%  LMP 12/27/2013 Physical Exam  Nursing note and vitals reviewed. Constitutional: She appears well-developed and well-nourished. No distress.  HENT:  Head: Normocephalic and atraumatic.  Neck: Neck supple.  Pulmonary/Chest: Effort normal.  Abdominal: Soft. She exhibits no distension. There is no tenderness. There is no rebound and no guarding.  Musculoskeletal: She exhibits no edema.       Arms: Spine nontender, no crepitus, or stepoffs.  Extremities:  Strength 5/5, sensation intact, distal pulses intact.    Palpation of left upper back reproduces pain.   Neurological: She is alert.  Skin: She is not diaphoretic.    ED Course  Procedures (  including critical care time) Labs Review Labs Reviewed - No data to display Imaging Review No results found.   EKG Interpretation None      MDM   Final diagnoses:  Upper back pain on left side    Left upper back pain worse with palpation and movement, reproduced with palpation, started after raking leaves 5 days ago and has been constant.  Has tried very light muscle relaxant (robaxin 250mg  or 500mg , pt unsure) and ibuprofen 600mg .  There is no bony tenderness. Neurovascularly intact.  No red flags.  Suspect muscle soreness/strain.  D/C home with valium and 800mg  ibuprofen, PCP follow up.  Pt HTN, likely because of pain, offered NSAID by nurse (pt is driving), pt advised to have BP rechecked within  1 week.  Discussed findings, treatment, and follow up  with patient.  Pt given return precautions.  Pt verbalizes understanding and agrees with plan.     I doubt any other EMC precluding discharge at this time including, but not necessarily limited to the following: cauda equina, acute cord compression or significant nerve root compression, ruptured disk, AAA, fracture, tumor, ACS, PE or infection.       Trixie Dredge, PA-C 01/14/14 1043

## 2014-01-17 NOTE — ED Provider Notes (Signed)
Medical screening examination/treatment/procedure(s) were performed by non-physician practitioner and as supervising physician I was immediately available for consultation/collaboration.   EKG Interpretation None        Christopher J. Pollina, MD 01/17/14 1110 

## 2015-01-05 ENCOUNTER — Emergency Department (HOSPITAL_COMMUNITY): Payer: Self-pay

## 2015-01-05 ENCOUNTER — Emergency Department (HOSPITAL_COMMUNITY)
Admission: EM | Admit: 2015-01-05 | Discharge: 2015-01-05 | Disposition: A | Discharge status: Home / Self Care | Payer: Self-pay | Attending: Emergency Medicine | Admitting: Emergency Medicine

## 2015-01-05 ENCOUNTER — Encounter (HOSPITAL_COMMUNITY): Payer: Self-pay

## 2015-01-05 DIAGNOSIS — Z79899 Other long term (current) drug therapy: Secondary | ICD-10-CM | POA: Insufficient documentation

## 2015-01-05 DIAGNOSIS — Z8679 Personal history of other diseases of the circulatory system: Secondary | ICD-10-CM | POA: Insufficient documentation

## 2015-01-05 DIAGNOSIS — E119 Type 2 diabetes mellitus without complications: Secondary | ICD-10-CM | POA: Insufficient documentation

## 2015-01-05 DIAGNOSIS — G510 Bell's palsy: Secondary | ICD-10-CM

## 2015-01-05 DIAGNOSIS — Z8659 Personal history of other mental and behavioral disorders: Secondary | ICD-10-CM | POA: Insufficient documentation

## 2015-01-05 DIAGNOSIS — Z8739 Personal history of other diseases of the musculoskeletal system and connective tissue: Secondary | ICD-10-CM | POA: Insufficient documentation

## 2015-01-05 DIAGNOSIS — J45909 Unspecified asthma, uncomplicated: Secondary | ICD-10-CM | POA: Insufficient documentation

## 2015-01-05 DIAGNOSIS — G43909 Migraine, unspecified, not intractable, without status migrainosus: Secondary | ICD-10-CM | POA: Insufficient documentation

## 2015-01-05 DIAGNOSIS — Z3202 Encounter for pregnancy test, result negative: Secondary | ICD-10-CM | POA: Insufficient documentation

## 2015-01-05 DIAGNOSIS — F319 Bipolar disorder, unspecified: Secondary | ICD-10-CM | POA: Insufficient documentation

## 2015-01-05 DIAGNOSIS — Z720 Tobacco use: Secondary | ICD-10-CM | POA: Insufficient documentation

## 2015-01-05 HISTORY — DX: Migraine, unspecified, not intractable, without status migrainosus: G43.909

## 2015-01-05 LAB — PREGNANCY, URINE: Preg Test, Ur: NEGATIVE

## 2015-01-05 MED ORDER — PREDNISONE 20 MG PO TABS: ORAL_TABLET | ORAL | Status: DC

## 2015-01-05 MED ORDER — VALACYCLOVIR HCL 1 G PO TABS: 1000.0000 mg | ORAL_TABLET | Freq: Three times a day (TID) | ORAL | Status: AC

## 2015-01-05 MED ORDER — SODIUM CHLORIDE 0.9 % IV BOLUS (SEPSIS)
1000.0000 mL | Freq: Once | INTRAVENOUS | Status: AC
  Administered 2015-01-05: 1000 mL via INTRAVENOUS

## 2015-01-05 MED ORDER — METOCLOPRAMIDE HCL 5 MG/ML IJ SOLN
10.0000 mg | Freq: Once | INTRAMUSCULAR | Status: AC
  Administered 2015-01-05: 10 mg via INTRAVENOUS
  Filled 2015-01-05: qty 2

## 2015-01-05 MED ORDER — DIPHENHYDRAMINE HCL 50 MG/ML IJ SOLN: 25.0000 mg | Freq: Once | INTRAMUSCULAR | Status: DC

## 2015-01-05 NOTE — ED Notes (Signed)
Patient transported to CT 

## 2015-01-05 NOTE — ED Provider Notes (Signed)
CSN: [id number]     Arrival date & time 01/05/15  1818 History   First MD Initiated Contact with Patient 01/05/15 1905     Chief Complaint  Patient presents with  . Ptosis     (Consider location/radiation/quality/duration/timing/severity/associated sxs/prior Treatment) Patient is a 44 y.o. female presenting with general illness.  Illness Location:  R face Quality:  Numbness, weakness Severity:  Moderate Onset quality:  Gradual Duration:  2 days Timing:  Constant Progression:  Worsening Chronicity:  New Context:  Spontaneous Relieved by:  Nothing Worsened by:  Nothing Associated symptoms: headaches (chronically)   Associated symptoms: no diarrhea, no nausea, no sore throat and no vomiting     Past Medical History  Diagnosis Date  . Asthma   . Diabetes mellitus   . DDD (degenerative disc disease)   . Bipolar 1 disorder   . Bipolar 1 disorder   . Migraine headache    Past Surgical History  Procedure Laterality Date  . Tubal ligation    . Colonoscopy     No family history on file. History  Substance Use Topics  . Smoking status: Current Every Day Smoker  . Smokeless tobacco: Not on file  . Alcohol Use: Yes   OB History    No data available     Review of Systems  HENT: Negative for sore throat.   Gastrointestinal: Negative for nausea, vomiting and diarrhea.  Neurological: Positive for headaches (chronically).  All other systems reviewed and are negative.     Allergies  Benadryl  Home Medications   Prior to Admission medications   Medication Sig Start Date End Date Taking? Authorizing Provider  ibuprofen (ADVIL,MOTRIN) 200 MG tablet Take 400 mg by mouth every 6 (six) hours as needed for moderate pain (pain).    Yes Historical Provider, MD  OIL OF OREGANO PO Take 1 capsule by mouth daily.   Yes Historical Provider, MD  diazepam (VALIUM) 5 MG tablet Take 1 tablet (5 mg total) by mouth every 8 (eight) hours as needed (muscle spasm or pain). Patient not  taking: Reported on 01/05/2015 01/14/14   Trixie Dredge, PA-C  ibuprofen (ADVIL,MOTRIN) 800 MG tablet Take 1 tablet (800 mg total) by mouth every 8 (eight) hours as needed for mild pain or moderate pain. Patient not taking: Reported on 01/05/2015 01/14/14   Trixie Dredge, PA-C  predniSONE (DELTASONE) 20 MG tablet 3 tabs po daily x 5 days, then 2 tabs x 3 days, then 1.5 tabs x 3 days, then 1 tab x 3 days, then 0.5 tabs x 3 days 01/05/15   Mirian Mo, MD  valACYclovir (VALTREX) 1000 MG tablet Take 1 tablet (1,000 mg total) by mouth 3 (three) times daily. 01/05/15 01/19/15  Mirian Mo, MD   BP 184/104 mmHg  Pulse 90  Temp(Src) 97.7 F (36.5 C) (Oral)  Resp 20  SpO2 97%  LMP 12/29/2014 (Exact Date) Physical Exam  Constitutional: She is oriented to person, place, and time. She appears well-developed and well-nourished.  HENT:  Head: Normocephalic and atraumatic.  Right Ear: External ear normal.  Left Ear: External ear normal.  Eyes: Conjunctivae and EOM are normal. Pupils are equal, round, and reactive to light.  Neck: Normal range of motion. Neck supple.  Cardiovascular: Normal rate, regular rhythm, normal heart sounds and intact distal pulses.   Pulmonary/Chest: Effort normal and breath sounds normal.  Abdominal: Soft. Bowel sounds are normal. There is no tenderness.  Musculoskeletal: Normal range of motion.  Neurological: She is alert and oriented  to person, place, and time. She has normal reflexes. A cranial nerve deficit (R facial droop with forehead involvement) and sensory deficit (R face) is present. She exhibits abnormal muscle tone (R face alone, rest of body unremarkable). Coordination and gait normal.  Skin: Skin is warm and dry.  Vitals reviewed.   ED Course  Procedures (including critical care time) Labs Review Labs Reviewed  PREGNANCY, URINE  POC URINE PREG, ED    Imaging Review Ct Head Wo Contrast  01/05/2015   CLINICAL DATA:  Right-sided headache for 10 days  EXAM: CT HEAD  WITHOUT CONTRAST  TECHNIQUE: Contiguous axial images were obtained from the base of the skull through the vertex without intravenous contrast.  COMPARISON:  None.  FINDINGS: No skull fracture is noted. Paranasal sinuses and mastoid air cells are unremarkable.  No intracranial hemorrhage, mass effect or midline shift.  No acute cortical infarction. No mass lesion is noted on this unenhanced scan. No hydrocephalus. The gray and white-matter differentiation is preserved.  IMPRESSION: No acute intracranial abnormality.   Electronically Signed   By: Natasha MeadLiviu  Pop M.D.   On: 01/05/2015 19:51     EKG Interpretation None      MDM   Final diagnoses:  Bell's palsy    44 y.o. female with pertinent PMH of bipolar 1 presents with R facial droop, isolated.  No symptoms in rest of body.  Pt has had a ha x 10 days, face symptoms began 2 days ago.  HA similar to prior, but lasting longer.  No focal neuro deficits outside of face on exam.  Exam consistent with bells palsy.    CT unremarkable.  Upreg negative.  DC home in stable condition  I have reviewed all laboratory and imaging studies if ordered as above  1. Bell's palsy         Mirian MoMatthew Gentry, MD 01/05/15 2026

## 2015-01-05 NOTE — Discharge Instructions (Signed)
Bell's Palsy °Bell's palsy is a condition in which the muscles on one side of the face cannot move (paralysis). This is because the nerves in the face are paralyzed. It is most often thought to be caused by a virus. The virus causes swelling of the nerve that controls movement on one side of the face. The nerve travels through a tight space surrounded by bone. When the nerve swells, it can be compressed by the bone. This results in damage to the protective covering around the nerve. This damage interferes with how the nerve communicates with the muscles of the face. As a result, it can cause weakness or paralysis of the facial muscles.  °Injury (trauma), tumor, and surgery may cause Bell's palsy, but most of the time the cause is unknown. It is a relatively common condition. It starts suddenly (abrupt onset) with the paralysis usually ending within 2 days. Bell's palsy is not dangerous. But because the eye does not close properly, you may need care to keep the eye from getting dry. This can include splinting (to keep the eye shut) or moistening with artificial tears. Bell's palsy very seldom occurs on both sides of the face at the same time. °SYMPTOMS  °· Eyebrow sagging. °· Drooping of the eyelid and corner of the mouth. °· Inability to close one eye. °· Loss of taste on the front of the tongue. °· Sensitivity to loud noises. °TREATMENT  °The treatment is usually non-surgical. If the patient is seen within the first 24 to 48 hours, a short course of steroids may be prescribed, in an attempt to shorten the length of the condition. Antiviral medicines may also be used with the steroids, but it is unclear if they are helpful.  °You will need to protect your eye, if you cannot close it. The cornea (clear covering over your eye) will become dry and can be damaged. Artificial tears can be used to keep your eye moist. Glasses or an eye patch should be worn to protect your eye. °PROGNOSIS  °Recovery is variable, ranging  from days to months. Although the problem usually goes away completely (about 80% of cases resolve), predicting the outcome is impossible. Most people improve within 3 weeks of when the symptoms began. Improvement may continue for 3 to 6 months. A small number of people have moderate to severe weakness that is permanent.  °HOME CARE INSTRUCTIONS  °· If your caregiver prescribed medication to reduce swelling in the nerve, use as directed. Do not stop taking the medication unless directed by your caregiver. °· Use moisturizing eye drops as needed to prevent drying of your eye, as directed by your caregiver. °· Protect your eye, as directed by your caregiver. °· Use facial massage and exercises, as directed by your caregiver. °· Perform your normal activities, and get your normal rest. °SEEK IMMEDIATE MEDICAL CARE IF:  °· There is pain, redness or irritation in the eye. °· You or your child has an oral temperature above 102° F (38.9° C), not controlled by medicine. °MAKE SURE YOU:  °· Understand these instructions. °· Will watch your condition. °· Will get help right away if you are not doing well or get worse. °Document Released: 11/02/2005 Document Revised: 01/25/2012 Document Reviewed: 02/09/2014 °[employer]® Patient Information ©2015 [employer]. This information is not intended to replace advice given to you by your health care provider. Make sure you discuss any questions you have with your health care provider. ° °

## 2015-01-05 NOTE — ED Notes (Signed)
She reports right-sided h/a x 10 days.  She noted some "numbness of my tongue" a couple of days ago, along with difficulty closing right eyelid with some drooling out of right side of mouth.  She is awake, alert and quite articulate.  She denies visual disturbances and has no balance disturbance or ataxia.

## 2022-02-08 ENCOUNTER — Emergency Department (HOSPITAL_COMMUNITY): Payer: BC Managed Care – PPO

## 2022-02-08 ENCOUNTER — Encounter (HOSPITAL_COMMUNITY): Payer: Self-pay

## 2022-02-08 ENCOUNTER — Emergency Department (HOSPITAL_COMMUNITY)
Admission: EM | Admit: 2022-02-08 | Discharge: 2022-02-09 | Disposition: A | Discharge status: Home / Self Care | Payer: BC Managed Care – PPO | Attending: Emergency Medicine | Admitting: Emergency Medicine

## 2022-02-08 DIAGNOSIS — J0100 Acute maxillary sinusitis, unspecified: Secondary | ICD-10-CM | POA: Insufficient documentation

## 2022-02-08 DIAGNOSIS — R519 Headache, unspecified: Secondary | ICD-10-CM | POA: Diagnosis present

## 2022-02-08 DIAGNOSIS — Z20822 Contact with and (suspected) exposure to covid-19: Secondary | ICD-10-CM | POA: Diagnosis not present

## 2022-02-08 DIAGNOSIS — G43909 Migraine, unspecified, not intractable, without status migrainosus: Secondary | ICD-10-CM | POA: Diagnosis not present

## 2022-02-08 DIAGNOSIS — G43009 Migraine without aura, not intractable, without status migrainosus: Secondary | ICD-10-CM

## 2022-02-08 LAB — I-STAT BETA HCG BLOOD, ED (MC, WL, AP ONLY): I-stat hCG, quantitative: 13.9 m[IU]/mL — ABNORMAL HIGH (ref <5)

## 2022-02-08 LAB — RESP PANEL BY RT-PCR (FLU A&B, COVID) ARPGX2
Influenza A by PCR: NEGATIVE
Influenza B by PCR: NEGATIVE
SARS Coronavirus 2 by RT PCR: NEGATIVE

## 2022-02-08 MED ORDER — SODIUM CHLORIDE 0.9 % IV BOLUS
1000.0000 mL | Freq: Once | INTRAVENOUS | Status: AC
  Administered 2022-02-08: 1000 mL via INTRAVENOUS

## 2022-02-08 MED ORDER — PROCHLORPERAZINE EDISYLATE 10 MG/2ML IJ SOLN
10.0000 mg | Freq: Once | INTRAMUSCULAR | Status: AC
  Administered 2022-02-08: 10 mg via INTRAVENOUS
  Filled 2022-02-08: qty 2

## 2022-02-08 MED ORDER — DEXAMETHASONE SODIUM PHOSPHATE 10 MG/ML IJ SOLN
10.0000 mg | Freq: Once | INTRAMUSCULAR | Status: AC
  Administered 2022-02-08: 10 mg via INTRAVENOUS
  Filled 2022-02-08: qty 1

## 2022-02-08 NOTE — ED Triage Notes (Signed)
Patient arrived with complaints of a migraine and suspected sinus infection over the last 5 days.  ?

## 2022-02-08 NOTE — ED Provider Triage Note (Signed)
Emergency Medicine Provider Triage Evaluation Note ? ?Melissa Lara , a 51 y.o. female  was evaluated in triage.  Pt complains of headache that started 5 days ago. Mainly over the left eye. Reports hx migraines. Reports associated uri sxs, nv and photophobia. ? ?Review of Systems  ?Positive: Headache, photophobia, nv, uri sxs ?Negative: Head trauma ? ?Physical Exam  ?BP 115/71 (BP Location: Right Arm)   Pulse (!) 107   Temp 97.6 ?F (36.4 ?C) (Oral)   Resp 18   SpO2 100%  ?Gen:   Awake, no distress   ?Resp:  Normal effort  ?MSK:   Moves extremities without difficulty  ?Other:  Cn II-XII intact. 5/5 strength to the bue/ble ? ?Medical Decision Making  ?Medically screening exam initiated at 9:25 PM.  Appropriate orders placed.  Melissa Lara was informed that the remainder of the evaluation will be completed by another provider, this initial triage assessment does not replace that evaluation, and the importance of remaining in the ED until their evaluation is complete. ? ? ?  ?Karrie Meres, PA-C ?02/08/22 2128 ? ?

## 2022-02-08 NOTE — ED Provider Notes (Signed)
?[facility]-EMERGENCY DEPT ?Provider Note ? ? ?CSN: [id number] ?Arrival date & time: 02/08/22  2043 ? ?  ?History ? ?Chief Complaint  ?Patient presents with  ? Migraine  ? ? ?[name] is a 51 y.o. female with hx of migraine here for evaluation of headache.  Began 4 days ago.  Has history of migraines however states it has been years since she had 1.  She denies any head trauma.  She has some light sensitivity.  No fever, facial droop, numbness, weakness, neck pain, neck stiffness.  Taking OTC meds without relief.  She is also noted some mild congestion and rhinorrhea over the last 48 hours.  No cough, shortness of breath, abdominal pain, emesis, sick contacts. ? ?HPI ? ?  ? ?Home Medications ?Prior to Admission medications   ?Medication Sig Start Date End Date Taking? Authorizing Provider  ?amoxicillin (AMOXIL) 500 MG capsule Take 1 capsule (500 mg total) by mouth 2 (two) times daily for 7 days. 02/09/22 02/16/22 Yes Shiza Thelen A, PA-C  ?cetirizine (ZYRTEC ALLERGY) 10 MG tablet Take 1 tablet (10 mg total) by mouth daily. 02/09/22  Yes Callista Hoh A, PA-C  ?fluticasone (FLONASE) 50 MCG/ACT nasal spray Place 2 sprays into both nostrils daily. 02/09/22  Yes Delaine Canter A, PA-C  ?diazepam (VALIUM) 5 MG tablet Take 1 tablet (5 mg total) by mouth every 8 (eight) hours as needed (muscle spasm or pain). ?Patient not taking: Reported on 01/05/2015 01/14/14   Trixie DredgeWest, Emily, PA-C  ?FARXIGA 10 MG TABS tablet Take 10 mg by mouth daily. 12/29/21   [provider]  ?hydrochlorothiazide (HYDRODIURIL) 12.5 MG tablet Take 12.5 mg by mouth daily. 02/07/22   [provider]  ?ibuprofen (ADVIL,MOTRIN) 200 MG tablet Take 400 mg by mouth every 6 (six) hours as needed for moderate pain (pain).     [provider]  ?ibuprofen (ADVIL,MOTRIN) 800 MG tablet Take 1 tablet (800 mg total) by mouth every 8 (eight) hours as needed for mild pain or moderate pain. ?Patient not taking:  Reported on 01/05/2015 01/14/14   Trixie DredgeWest, Emily, PA-C  ?lisinopril (ZESTRIL) 10 MG tablet Take 10 mg by mouth daily. 10/21/21   [provider]  ?lisinopril (ZESTRIL) 20 MG tablet Take 20 mg by mouth daily. 02/04/22   [provider]  ?OIL OF OREGANO PO Take 1 capsule by mouth daily.    [provider]  ?predniSONE (DELTASONE) 20 MG tablet 3 tabs po daily x 5 days, then 2 tabs x 3 days, then 1.5 tabs x 3 days, then 1 tab x 3 days, then 0.5 tabs x 3 days 01/05/15   Mirian MoGentry, Matthew, MD  ?   ? ?Allergies    ?Benadryl [diphenhydramine hcl]   ? ?Review of Systems   ?Review of Systems  ?Constitutional: Negative.   ?HENT:  Positive for congestion, postnasal drip, rhinorrhea, sinus pressure and sinus pain. Negative for ear discharge, ear pain, facial swelling, sore throat, tinnitus and trouble swallowing.   ?Respiratory: Negative.    ?Cardiovascular: Negative.   ?Gastrointestinal: Negative.   ?Genitourinary: Negative.   ?Musculoskeletal: Negative.   ?Skin: Negative.   ?Neurological:  Positive for headaches.  ?All other systems reviewed and are negative. ? ?Physical Exam ?Updated Vital Signs ?BP 119/62   Pulse 86   Temp 97.6 ?F (36.4 ?C) (Oral)   Resp 20   SpO2 97%  ?Physical Exam ?Physical Exam  ?Constitutional: Pt is oriented to person, place, and time. Pt appears well-developed and well-nourished.  No distress.  ?HENT:  ?Head: Normocephalic and atraumatic.  ?Mouth/Throat: Oropharynx is clear and moist.  ?Eyes: Conjunctivae and EOM are normal. Pupils are equal, round, and reactive to light. No scleral icterus.  ?No horizontal, vertical or rotational nystagmus  ?Nose: Congestion Bl, purulent rhinorrhea ?Neck: Normal range of motion. Neck supple.  ?Full active and passive ROM without pain ?No midline or paraspinal tenderness ?No nuchal rigidity or meningeal signs  ?Cardiovascular: Normal rate, regular rhythm and intact distal pulses.   ?Pulmonary/Chest: Effort normal and breath sounds normal. No  respiratory distress. Pt has no wheezes. No rales.  ?Abdominal: Soft. Bowel sounds are normal. There is no tenderness. There is no rebound and no guarding.  ?Musculoskeletal: Normal range of motion.  ?Lymphadenopathy:  ?  No cervical adenopathy.  ?Neurological: Pt. is alert and oriented to person, place, and time. He has normal reflexes. No cranial nerve deficit.  Exhibits normal muscle tone. Coordination normal.  ?Mental Status:  ?Alert, oriented, thought content appropriate. Speech fluent without evidence of aphasia. Able to follow 2 step commands without difficulty.  ?Cranial Nerves:  ?II:  Peripheral visual fields grossly normal, pupils equal, round, reactive to light ?III,IV, VI: ptosis not present, extra-ocular motions intact bilaterally  ?V,VII: smile symmetric, facial light touch sensation equal ?VIII: hearing grossly normal bilaterally  ?IX,X: midline uvula rise  ?XI: bilateral shoulder shrug equal and strong ?XII: midline tongue extension  ?Motor:  ?5/5 in upper and lower extremities bilaterally including strong and equal grip strength and dorsiflexion/plantar flexion ?Sensory: Pinprick and light touch normal in all extremities.  ?Deep Tendon Reflexes: 2+ and symmetric  ?Cerebellar: normal finger-to-nose with bilateral upper extremities ?Gait: normal gait and balance ?CV: distal pulses palpable throughout   ?Skin: Skin is warm and dry. No rash noted. Pt is not diaphoretic.  ?Psychiatric: Pt has a normal mood and affect. Behavior is normal. Judgment and thought content normal.  ?Nursing note and vitals reviewed.  ?ED Results / Procedures / Treatments   ?Labs ?(all labs ordered are listed, but only abnormal results are displayed) ?Labs Reviewed  ?I-STAT BETA HCG BLOOD, ED ([facility], [facility], [facility] ONLY) - Abnormal; Notable for the following components:  ?    Result Value  ? I-stat hCG, quantitative 13.9 (*)   ? All other components within normal limits  ?RESP PANEL BY RT-PCR (FLU A&B, COVID) ARPGX2  ?HCG, QUANTITATIVE,  PREGNANCY  ? ? ?EKG ?None ? ?Radiology ?CT HEAD WO CONTRAST ( ) ? ?Result Date: 02/08/2022 ?CLINICAL DATA:  Headache. EXAM: CT HEAD WITHOUT CONTRAST TECHNIQUE: Contiguous axial images were obtained from the base of the skull through the vertex without intravenous contrast. RADIATION DOSE REDUCTION: This exam was performed according to the departmental dose-optimization program which includes automated exposure control, adjustment of the mA and/or kV according to patient size and/or use of iterative reconstruction technique. COMPARISON:  Head CT dated 01/05/2015. FINDINGS: Brain: The ventricles and sulci are appropriate size for the patient's age. The gray-white matter discrimination is preserved. There is no acute intracranial hemorrhage. No mass effect or midline shift. No extra-axial fluid collection. Vascular: No hyperdense vessel or unexpected calcification. Skull: Normal. Negative for fracture or focal lesion. Sinuses/Orbits: Diffuse mucoperiosteal thickening of paranasal sinuses. There is complete opacification of the visualized left maxillary sinus and ethmoid air cells. The mastoid air cells are clear. Other: None IMPRESSION: 1. No acute intracranial pathology. 2. Paranasal sinus disease. Electronically Signed   By: Elgie Collard M.D.   On: 02/08/2022 23:48   ? ?Procedures ?Procedures  ? ? ?  Medications Ordered in ED ?Medications  ?prochlorperazine (COMPAZINE) injection 10 mg (10 mg Intravenous Given 02/08/22 2258)  ?dexamethasone (DECADRON) injection 10 mg (10 mg Intravenous Given 02/08/22 2257)  ?sodium chloride 0.9 % bolus 1,000 mL (0 mLs Intravenous Stopped 02/09/22 0100)  ? ? ?ED Course/ Medical Decision Making/ A&P ?  ? ?51 year old here for evaluation of headache over the last 4 days.  No recent traumatic injury.  Afebrile, nonseptic, not ill-appearing.  Does have history of headaches states this feels similar however is been years since she had migraine.  She denies any vision changes, numbness,  weakness, difficulty with word finding.  She has no neck stiffness neck rigidity.  She has no meningismus.  Also with some congestion or rhinorrhea. ? ?Labs and imaging personally viewed and interpreted: ?CT head with

## 2022-02-09 LAB — HCG, QUANTITATIVE, PREGNANCY: hCG, Beta Chain, Quant, S: <1 m[IU]/mL (ref <5)

## 2022-02-09 MED ORDER — CETIRIZINE HCL 10 MG PO TABS: 10.0000 mg | ORAL_TABLET | Freq: Every day | ORAL | 0 refills | Status: AC

## 2022-02-09 MED ORDER — AMOXICILLIN 500 MG PO CAPS: 500.0000 mg | ORAL_CAPSULE | Freq: Two times a day (BID) | ORAL | 0 refills | Status: AC

## 2022-02-09 MED ORDER — FLUTICASONE PROPIONATE 50 MCG/ACT NA SUSP: 2.0000 | Freq: Every day | NASAL | 0 refills | Status: AC

## 2022-02-09 NOTE — Discharge Instructions (Addendum)
Your CT scan was reassuring however did show signs of a sinus infection.  I have started you on antibiotics.  Take as prescribed.  I have also written for an antihistamine as well as a nasal spray. ? ?Return for new or worsening symptoms. ?

## 2022-02-28 ENCOUNTER — Emergency Department (HOSPITAL_COMMUNITY)
Admission: EM | Admit: 2022-02-28 | Discharge: 2022-02-28 | Disposition: A | Discharge status: Home / Self Care | Payer: BC Managed Care – PPO | Attending: Emergency Medicine | Admitting: Emergency Medicine

## 2022-02-28 ENCOUNTER — Other Ambulatory Visit: Payer: Self-pay

## 2022-02-28 ENCOUNTER — Encounter (HOSPITAL_COMMUNITY): Payer: Self-pay | Admitting: *Deleted

## 2022-02-28 DIAGNOSIS — R1084 Generalized abdominal pain: Secondary | ICD-10-CM

## 2022-02-28 DIAGNOSIS — J329 Chronic sinusitis, unspecified: Secondary | ICD-10-CM | POA: Diagnosis not present

## 2022-02-28 DIAGNOSIS — Z7951 Long term (current) use of inhaled steroids: Secondary | ICD-10-CM | POA: Insufficient documentation

## 2022-02-28 DIAGNOSIS — R112 Nausea with vomiting, unspecified: Secondary | ICD-10-CM

## 2022-02-28 DIAGNOSIS — E119 Type 2 diabetes mellitus without complications: Secondary | ICD-10-CM | POA: Diagnosis not present

## 2022-02-28 DIAGNOSIS — Z20822 Contact with and (suspected) exposure to covid-19: Secondary | ICD-10-CM | POA: Insufficient documentation

## 2022-02-28 DIAGNOSIS — J45909 Unspecified asthma, uncomplicated: Secondary | ICD-10-CM | POA: Diagnosis not present

## 2022-02-28 DIAGNOSIS — R519 Headache, unspecified: Secondary | ICD-10-CM | POA: Diagnosis present

## 2022-02-28 DIAGNOSIS — Z79899 Other long term (current) drug therapy: Secondary | ICD-10-CM | POA: Diagnosis not present

## 2022-02-28 LAB — CBC WITH DIFFERENTIAL/PLATELET
Abs Immature Granulocytes: 0.02 10*3/uL (ref 0.00–0.07)
Basophils Absolute: 0.1 10*3/uL (ref 0.0–0.1)
Basophils Relative: 1 %
Eosinophils Absolute: 0.1 10*3/uL (ref 0.0–0.5)
Eosinophils Relative: 1 %
HCT: 47.9 % — ABNORMAL HIGH (ref 36.0–46.0)
Hemoglobin: 16.8 g/dL — ABNORMAL HIGH (ref 12.0–15.0)
Immature Granulocytes: 0 %
Lymphocytes Relative: 28 %
Lymphs Abs: 2.1 10*3/uL (ref 0.7–4.0)
MCH: 31.9 pg (ref 26.0–34.0)
MCHC: 35.1 g/dL (ref 30.0–36.0)
MCV: 91.1 fL (ref 80.0–100.0)
Monocytes Absolute: 0.5 10*3/uL (ref 0.1–1.0)
Monocytes Relative: 6 %
Neutro Abs: 4.8 10*3/uL (ref 1.7–7.7)
Neutrophils Relative %: 64 %
Platelets: 320 10*3/uL (ref 150–400)
RBC: 5.26 MIL/uL — ABNORMAL HIGH (ref 3.87–5.11)
RDW: 14.6 % (ref 11.5–15.5)
WBC: 7.5 10*3/uL (ref 4.0–10.5)
nRBC: 0 % (ref 0.0–0.2)

## 2022-02-28 LAB — COMPREHENSIVE METABOLIC PANEL
ALT: 21 U/L (ref 0–44)
AST: 46 U/L — ABNORMAL HIGH (ref 15–41)
Albumin: 3.5 g/dL (ref 3.5–5.0)
Alkaline Phosphatase: 104 U/L (ref 38–126)
Anion gap: 12 (ref 5–15)
BUN: 18 mg/dL (ref 6–20)
CO2: 27 mmol/L (ref 22–32)
Calcium: 8.5 mg/dL — ABNORMAL LOW (ref 8.9–10.3)
Chloride: 96 mmol/L — ABNORMAL LOW (ref 98–111)
Creatinine, Ser: 0.89 mg/dL (ref 0.44–1.00)
GFR, Estimated: >60 mL/min (ref >60)
Glucose, Bld: 244 mg/dL — ABNORMAL HIGH (ref 70–99)
Potassium: 3.9 mmol/L (ref 3.5–5.1)
Sodium: 135 mmol/L (ref 135–145)
Total Bilirubin: 0.8 mg/dL (ref 0.3–1.2)
Total Protein: 6.4 g/dL — ABNORMAL LOW (ref 6.5–8.1)

## 2022-02-28 LAB — URINALYSIS, ROUTINE W REFLEX MICROSCOPIC
Bilirubin Urine: NEGATIVE
Glucose, UA: >=500 mg/dL — AB
Hgb urine dipstick: NEGATIVE
Ketones, ur: 5 mg/dL — AB
Leukocytes,Ua: NEGATIVE
Nitrite: NEGATIVE
Protein, ur: NEGATIVE mg/dL
Specific Gravity, Urine: 1.033 — ABNORMAL HIGH (ref 1.005–1.030)
pH: 5 (ref 5.0–8.0)

## 2022-02-28 LAB — RESP PANEL BY RT-PCR (FLU A&B, COVID) ARPGX2
Influenza A by PCR: NEGATIVE
Influenza B by PCR: NEGATIVE
SARS Coronavirus 2 by RT PCR: NEGATIVE

## 2022-02-28 LAB — LIPASE, BLOOD: Lipase: 26 U/L (ref 11–51)

## 2022-02-28 MED ORDER — ONDANSETRON 4 MG PO TBDP: 4.0000 mg | ORAL_TABLET | Freq: Three times a day (TID) | ORAL | 0 refills | Status: DC | PRN

## 2022-02-28 MED ORDER — ONDANSETRON HCL 4 MG/2ML IJ SOLN
4.0000 mg | Freq: Once | INTRAMUSCULAR | Status: AC
  Administered 2022-02-28: 4 mg via INTRAVENOUS
  Filled 2022-02-28: qty 2

## 2022-02-28 MED ORDER — ONDANSETRON 4 MG PO TBDP: 4.0000 mg | ORAL_TABLET | Freq: Once | ORAL | Status: DC

## 2022-02-28 MED ORDER — SODIUM CHLORIDE 0.9 % IV BOLUS
1000.0000 mL | Freq: Once | INTRAVENOUS | Status: AC
  Administered 2022-02-28: 1000 mL via INTRAVENOUS

## 2022-02-28 MED ORDER — PREDNISONE 20 MG PO TABS: ORAL_TABLET | ORAL | 0 refills | Status: AC

## 2022-02-28 NOTE — ED Provider Triage Note (Signed)
Emergency Medicine Provider Triage Evaluation Note ? ?Melissa Lara , a 51 y.o. female  was evaluated in triage.  Pt complains of nausea, vomiting, abdominal pain, sore throat, sinus pressure.  The patient states that she has been fighting a sinus infection for approximately 2 months.  She has completed 2 courses of antibiotics.  She believes that she still may have some sinus infection.  4 days ago the patient began to have abdominal pain with nausea and vomiting.  She also endorses sore throat that began around the same time.  Denies shortness of breath, denies chest pain ? ?Review of Systems  ?Positive: Nausea, vomiting, abdominal pain, sore throat ?Negative: Chest pain, shortness of breath ? ?Physical Exam  ?BP (!) 172/115 (BP Location: Right Arm)   Pulse (!) 106   Temp 98.2 ?F (36.8 ?C) (Oral)   Resp 20   Ht 5\' 5"  (1.651 m)   Wt 63.5 kg   SpO2 100%   BMI 23.30 kg/m?  ?Gen:   Awake, no distress   ?Resp:  Normal effort  ?MSK:   Moves extremities without difficulty  ?Other:   ? ?Medical Decision Making  ?Medically screening exam initiated at 9:09 AM.  Appropriate orders placed.  was informed that the remainder of the evaluation will be completed by another provider, this initial triage assessment does not replace that evaluation, and the importance of remaining in the ED until their evaluation is complete. ? ? ?  ?Melissa Cleaver, PA-C ?02/28/22 03/02/22 ? ?

## 2022-02-28 NOTE — ED Provider Notes (Signed)
?[facility]-EMERGENCY DEPT ?Provider Note ? ? ?CSN: [id number] ?Arrival date & time: 02/28/22  7001 ? ?  ? ?History ? ?Chief Complaint  ?Patient presents with  ? Abdominal Pain  ? Nausea  ? Emesis  ? Sore Throat  ? Headache  ? Facial Pain  ? ? ?Melissa Lara is a 51 y.o. female who presents emergency department complaining of nausea, vomiting, abdominal pain, sore throat, and sinus pressure.  Patient states that she has been fighting a sinus infection for 2 months, and has completed 2 courses of antibiotics.  She believes she still has a sinus infection.  4 days ago she started having abdominal pain with nausea and vomiting. ? ? ?Abdominal Pain ?Associated symptoms: constipation, nausea and vomiting   ?Associated symptoms: no diarrhea, no dysuria and no fever   ?Emesis ?Associated symptoms: abdominal pain and headaches   ?Associated symptoms: no diarrhea and no fever   ?Sore Throat ?Associated symptoms include abdominal pain and headaches.  ?Headache ?Associated symptoms: abdominal pain, congestion, drainage, nausea, sinus pressure and vomiting   ?Associated symptoms: no diarrhea and no fever   ? ?  ? ?Home Medications ?Prior to Admission medications   ?Medication Sig Start Date End Date Taking? Authorizing Provider  ?ondansetron (ZOFRAN-ODT) 4 MG disintegrating tablet Take 1 tablet (4 mg total) by mouth every 8 (eight) hours as needed for nausea or vomiting. 02/28/22  Yes Jakyiah Briones T, PA-C  ?predniSONE (DELTASONE) 20 MG tablet Take 2 tablets daily for 5 days, then take 1 tablet daily for 5 days 02/28/22  Yes Aamna Mallozzi T, PA-C  ?cetirizine (ZYRTEC ALLERGY) 10 MG tablet Take 1 tablet (10 mg total) by mouth daily. 02/09/22   Henderly, Britni A, PA-C  ?diazepam (VALIUM) 5 MG tablet Take 1 tablet (5 mg total) by mouth every 8 (eight) hours as needed (muscle spasm or pain). ?Patient not taking: Reported on 01/05/2015 01/14/14   Trixie Dredge, PA-C  ?FARXIGA 10 MG TABS tablet Take 10 mg  by mouth daily. 12/29/21   [provider]  ?fluticasone (FLONASE) 50 MCG/ACT nasal spray Place 2 sprays into both nostrils daily. 02/09/22   Henderly, Britni A, PA-C  ?hydrochlorothiazide (HYDRODIURIL) 12.5 MG tablet Take 12.5 mg by mouth daily. 02/07/22   [provider]  ?ibuprofen (ADVIL,MOTRIN) 200 MG tablet Take 400 mg by mouth every 6 (six) hours as needed for moderate pain (pain).     [provider]  ?ibuprofen (ADVIL,MOTRIN) 800 MG tablet Take 1 tablet (800 mg total) by mouth every 8 (eight) hours as needed for mild pain or moderate pain. ?Patient not taking: Reported on 01/05/2015 01/14/14   Trixie Dredge, PA-C  ?lisinopril (ZESTRIL) 10 MG tablet Take 10 mg by mouth daily. 10/21/21   [provider]  ?lisinopril (ZESTRIL) 20 MG tablet Take 20 mg by mouth daily. 02/04/22   [provider]  ?OIL OF OREGANO PO Take 1 capsule by mouth daily.    [provider]  ?   ? ?Allergies    ?Benadryl [diphenhydramine hcl]   ? ?Review of Systems   ?Review of Systems  ?Constitutional:  Negative for fever.  ?HENT:  Positive for congestion, postnasal drip, sinus pressure and sinus pain.   ?Gastrointestinal:  Positive for abdominal pain, constipation, nausea and vomiting. Negative for abdominal distention, blood in stool and diarrhea.  ?Genitourinary:  Negative for dysuria, frequency and urgency.  ?Neurological:  Positive for headaches. Negative for syncope and light-headedness.  ?All other systems reviewed and  are negative. ? ?Physical Exam ?Updated Vital Signs ?BP (!) 160/97   Pulse 84   Temp 98.4 ?F (36.9 ?C) (Oral)   Resp 18   Ht 5\' 5"  (1.651 m)   Wt 63.5 kg   SpO2 94%   BMI 23.30 kg/m?  ?Physical Exam ?Vitals and nursing note reviewed.  ?Constitutional:   ?   Appearance: Normal appearance.  ?HENT:  ?   Head: Normocephalic and atraumatic.  ?Eyes:  ?   Conjunctiva/sclera: Conjunctivae normal.  ?Cardiovascular:  ?   Rate and Rhythm: Normal rate and regular rhythm.   ?Pulmonary:  ?   Effort: Pulmonary effort is normal. No respiratory distress.  ?   Breath sounds: Normal breath sounds.  ?Abdominal:  ?   General: There is no distension.  ?   Palpations: Abdomen is soft.  ?   Tenderness: There is generalized abdominal tenderness. There is no right CVA tenderness, left CVA tenderness, guarding or rebound.  ?Skin: ?   General: Skin is warm and dry.  ?Neurological:  ?   General: No focal deficit present.  ?   Mental Status: She is alert.  ? ? ?ED Results / Procedures / Treatments   ?Labs ?(all labs ordered are listed, but only abnormal results are displayed) ?Labs Reviewed  ?CBC WITH DIFFERENTIAL/PLATELET - Abnormal; Notable for the following components:  ?    Result Value  ? RBC 5.26 (*)   ? Hemoglobin 16.8 (*)   ? HCT 47.9 (*)   ? All other components within normal limits  ?COMPREHENSIVE METABOLIC PANEL - Abnormal; Notable for the following components:  ? Chloride 96 (*)   ? Glucose, Bld 244 (*)   ? Calcium 8.5 (*)   ? Total Protein 6.4 (*)   ? AST 46 (*)   ? All other components within normal limits  ?URINALYSIS, ROUTINE W REFLEX MICROSCOPIC - Abnormal; Notable for the following components:  ? APPearance HAZY (*)   ? Specific Gravity, Urine 1.033 (*)   ? Glucose, UA >=500 (*)   ? Ketones, ur 5 (*)   ? Bacteria, UA RARE (*)   ? All other components within normal limits  ?RESP PANEL BY RT-PCR (FLU A&B, COVID) ARPGX2  ?LIPASE, BLOOD  ? ? ?EKG ?None ? ?Radiology ?No results found. ? ?Procedures ?Procedures  ? ? ?Medications Ordered in ED ?Medications  ?sodium chloride 0.9 % bolus 1,000 mL (0 mLs Intravenous Stopped 02/28/22 1527)  ?ondansetron [facility]) injection 4 mg (4 mg Intravenous Given 02/28/22 1412)  ? ? ?ED Course/ Medical Decision Making/ A&P ?  ?                        ?Medical Decision Making ?This patient is a 51 year old female who presents to the ED for concern of abdominal pain and vomiting.  ? ?Differential diagnoses prior to evaluation: ?The emergent differential  diagnosis includes, but is not limited to,  AAA, mesenteric ischemia, appendicitis, diverticulitis, DKA, gastritis/gastroenteritis, nephrolithiasis, pancreatitis, peritonitis, constipation, UTI, SBO/LBO, biliary disease, IBD/IBS, PUD, or hepatitis, post nasal drip. This is not an exhaustive differential.  ? ?Past Medical History / Co-morbidities: ?Asthma, diabetes, bipolar 1, migraines ? ?Physical Exam: ?Physical exam performed. The pertinent findings include: Patient is afebrile, not tachycardic, and in no acute distress.  Abdomen is soft, with generalized tenderness.  No focal tenderness or guarding. ? ?Lab Tests/Imaging studies: ?I Ordered, and personally interpreted labs/imaging including CBC, CMP, urinalysis, lipase, respiratory panel.  The pertinent  results include: No leukocytosis, hemoglobin stable compared to prior.  Electrolytes grossly within normal limits, with a glucose of 244.  Urinalysis negative for infection.  Respiratory panel negative for COVID and flu.  Normal lipase. ? ?Medications: ?I ordered medication including IV fluids and Zofran for nausea.  I have reviewed the patients home medicines and have made adjustments as needed. ?  ?Disposition: ?After consideration of the diagnostic results and the patients response to treatment, I feel that patient is not requiring admission or inpatient treatment for her symptoms. I suspect her vomiting was likely due to post nasal drip and sinus pressure from chronic sinusitis. Will treat with course of steroids and antiemetics and recommend follow up with PCP to discuss ENT referral. Discussed reasons to return to the emergency department, and the patient is agreeable to the plan.  ? ?Final Clinical Impression(s) / ED Diagnoses ?Final diagnoses:  ?Chronic sinusitis, unspecified location  ?Generalized abdominal pain  ?Nausea and vomiting, unspecified vomiting type  ? ? ?Rx / DC Orders ?ED Discharge Orders   ? ?      Ordered  ?  predniSONE (DELTASONE) 20 MG  tablet       ? 02/28/22 1606  ?  ondansetron (ZOFRAN-ODT) 4 MG disintegrating tablet  Every 8 hours PRN       ? 02/28/22 1606  ? ?  ?  ? ?  ? ?Portions of this report may have been transcribed using voice recognition softwar

## 2022-02-28 NOTE — ED Triage Notes (Signed)
Pt comes in with N/V sore throat, headache which started 4 days ago although has and is currently treated for sinus infection. States she has been constipated for 2 months. ?

## 2022-02-28 NOTE — Discharge Instructions (Addendum)
He was in the emergency department today for abdominal pain and sinusitis. ? ?As we discussed your lab work looked reassuring today.  I think that you likely were feeling nauseous from postnasal drip.  I am giving a prescription for steroids as well as nausea medicine. ? ?I recommend following up with your primary doctor, to discuss seeing the ENT (ear, nose, throat) doctor. ?

## 2022-03-07 ENCOUNTER — Emergency Department (HOSPITAL_COMMUNITY)
Admission: EM | Admit: 2022-03-07 | Discharge: 2022-03-07 | Disposition: A | Discharge status: Home / Self Care | Payer: BC Managed Care – PPO | Attending: Emergency Medicine | Admitting: Emergency Medicine

## 2022-03-07 ENCOUNTER — Encounter (HOSPITAL_COMMUNITY): Payer: Self-pay

## 2022-03-07 DIAGNOSIS — R1010 Upper abdominal pain, unspecified: Secondary | ICD-10-CM | POA: Diagnosis present

## 2022-03-07 DIAGNOSIS — E119 Type 2 diabetes mellitus without complications: Secondary | ICD-10-CM | POA: Diagnosis not present

## 2022-03-07 DIAGNOSIS — R748 Abnormal levels of other serum enzymes: Secondary | ICD-10-CM | POA: Insufficient documentation

## 2022-03-07 DIAGNOSIS — J45909 Unspecified asthma, uncomplicated: Secondary | ICD-10-CM | POA: Insufficient documentation

## 2022-03-07 DIAGNOSIS — Z79899 Other long term (current) drug therapy: Secondary | ICD-10-CM | POA: Diagnosis not present

## 2022-03-07 DIAGNOSIS — K852 Alcohol induced acute pancreatitis without necrosis or infection: Secondary | ICD-10-CM | POA: Diagnosis not present

## 2022-03-07 LAB — COMPREHENSIVE METABOLIC PANEL
ALT: 30 U/L (ref 0–44)
AST: 26 U/L (ref 15–41)
Albumin: 3.8 g/dL (ref 3.5–5.0)
Alkaline Phosphatase: 91 U/L (ref 38–126)
Anion gap: 12 (ref 5–15)
BUN: 19 mg/dL (ref 6–20)
CO2: 30 mmol/L (ref 22–32)
Calcium: 9.1 mg/dL (ref 8.9–10.3)
Chloride: 89 mmol/L — ABNORMAL LOW (ref 98–111)
Creatinine, Ser: 0.78 mg/dL (ref 0.44–1.00)
GFR, Estimated: >60 mL/min (ref >60)
Glucose, Bld: 344 mg/dL — ABNORMAL HIGH (ref 70–99)
Potassium: 4.2 mmol/L (ref 3.5–5.1)
Sodium: 131 mmol/L — ABNORMAL LOW (ref 135–145)
Total Bilirubin: 0.7 mg/dL (ref 0.3–1.2)
Total Protein: 7.1 g/dL (ref 6.5–8.1)

## 2022-03-07 LAB — URINALYSIS, ROUTINE W REFLEX MICROSCOPIC
Bacteria, UA: NONE SEEN
Bilirubin Urine: NEGATIVE
Glucose, UA: >=500 mg/dL — AB
Hgb urine dipstick: NEGATIVE
Ketones, ur: 20 mg/dL — AB
Leukocytes,Ua: NEGATIVE
Nitrite: NEGATIVE
Protein, ur: NEGATIVE mg/dL
Specific Gravity, Urine: 1.009 (ref 1.005–1.030)
pH: 7 (ref 5.0–8.0)

## 2022-03-07 LAB — I-STAT BETA HCG BLOOD, ED (MC, WL, AP ONLY): I-stat hCG, quantitative: <5 m[IU]/mL (ref <5)

## 2022-03-07 LAB — CBC
HCT: 42.4 % (ref 36.0–46.0)
Hemoglobin: 15.1 g/dL — ABNORMAL HIGH (ref 12.0–15.0)
MCH: 32.5 pg (ref 26.0–34.0)
MCHC: 35.6 g/dL (ref 30.0–36.0)
MCV: 91.2 fL (ref 80.0–100.0)
Platelets: 207 10*3/uL (ref 150–400)
RBC: 4.65 MIL/uL (ref 3.87–5.11)
RDW: 15 % (ref 11.5–15.5)
WBC: 10.9 10*3/uL — ABNORMAL HIGH (ref 4.0–10.5)
nRBC: 0 % (ref 0.0–0.2)

## 2022-03-07 LAB — LIPASE, BLOOD: Lipase: 187 U/L — ABNORMAL HIGH (ref 11–51)

## 2022-03-07 MED ORDER — HYDROMORPHONE HCL 1 MG/ML IJ SOLN
1.0000 mg | Freq: Once | INTRAMUSCULAR | Status: AC
  Administered 2022-03-07: 1 mg via INTRAVENOUS
  Filled 2022-03-07: qty 1

## 2022-03-07 MED ORDER — SODIUM CHLORIDE 0.9 % IV SOLN: 1000.0000 mL | INTRAVENOUS | Status: DC

## 2022-03-07 MED ORDER — ONDANSETRON 8 MG PO TBDP: 8.0000 mg | ORAL_TABLET | Freq: Three times a day (TID) | ORAL | 0 refills | Status: DC | PRN

## 2022-03-07 MED ORDER — SODIUM CHLORIDE 0.9 % IV BOLUS (SEPSIS)
1000.0000 mL | Freq: Once | INTRAVENOUS | Status: AC
  Administered 2022-03-07: 1000 mL via INTRAVENOUS

## 2022-03-07 MED ORDER — HYDROCODONE-ACETAMINOPHEN 5-325 MG PO TABS: 1.0000 | ORAL_TABLET | Freq: Four times a day (QID) | ORAL | 0 refills | Status: AC | PRN

## 2022-03-07 MED ORDER — ONDANSETRON HCL 4 MG/2ML IJ SOLN
4.0000 mg | Freq: Once | INTRAMUSCULAR | Status: AC
  Administered 2022-03-07: 4 mg via INTRAVENOUS
  Filled 2022-03-07: qty 2

## 2022-03-07 NOTE — ED Triage Notes (Signed)
Pt presents with c/o abdominal pain and dry-heaving since approx 8:30pm last night. Pt believes she is having a flare-up of her pancreatitis.  ?

## 2022-03-07 NOTE — Discharge Instructions (Signed)
Avoid drinking alcohol.  It can cause recurrent pancreatitis attacks.  Take the medications as needed for nausea and pain.  Liquid diet for the next 24 hours.  Slowly advance your diet as tolerated.  Return to the ED for worsening symptoms ?

## 2022-03-07 NOTE — ED Notes (Signed)
Patient noted to have her O2 sat dropped to 88% RA after IV narcotic administration.  Patient encouraged to take deep breathes and O2 rose to 95%.  Patient placed on 2L [place] to prevent O2 sat from dropping.  Pt alert and oriented. Reports improvement in pain. ?

## 2022-03-07 NOTE — ED Provider Notes (Signed)
?[facility] DEPT ?Provider Note ? ? ?CSN: [id number] ?Arrival date & time: 03/07/22  1052 ? ?  ? ?History ? ?Chief Complaint  ?Patient presents with  ? Abdominal Pain  ? ? ?Melissa Lara is a 51 y.o. female. ? ? ?Abdominal Pain ?Associated symptoms: no fever   ? ?Patient has a history of asthma, diabetes, bipolar disorder, migraines.  She also has a history of prior pancreatitis.  She had a tubal ligation in the past.  Patient states she started having abdominal pain last evening.  She is having severe pain in her upper abdomen that goes towards the back.  She has had nausea vomiting and dry heaves.  She did have pancreatitis once in the past and this feels very similar.  Patient states she is not sure what caused her pancreatitis before.  She does admit to daily alcohol use ? ?Home Medications ?Prior to Admission medications   ?Medication Sig Start Date End Date Taking? Authorizing Provider  ?HYDROcodone-acetaminophen (NORCO/VICODIN) 5-325 MG tablet Take 1 tablet by mouth every 6 (six) hours as needed. 03/07/22  Yes Dorie Rank, MD  ?ondansetron (ZOFRAN-ODT) 8 MG disintegrating tablet Take 1 tablet (8 mg total) by mouth every 8 (eight) hours as needed for nausea or vomiting. 03/07/22  Yes Dorie Rank, MD  ?cetirizine (ZYRTEC ALLERGY) 10 MG tablet Take 1 tablet (10 mg total) by mouth daily. 02/09/22   Henderly, Britni A, PA-C  ?diazepam (VALIUM) 5 MG tablet Take 1 tablet (5 mg total) by mouth every 8 (eight) hours as needed (muscle spasm or pain). ?Patient not taking: Reported on 01/05/2015 01/14/14   Clayton Bibles, PA-C  ?FARXIGA 10 MG TABS tablet Take 10 mg by mouth daily. 12/29/21   [provider]  ?fluticasone (FLONASE) 50 MCG/ACT nasal spray Place 2 sprays into both nostrils daily. 02/09/22   Henderly, Britni A, PA-C  ?hydrochlorothiazide (HYDRODIURIL) 12.5 MG tablet Take 12.5 mg by mouth daily. 02/07/22   [provider]  ?ibuprofen (ADVIL,MOTRIN) 200 MG tablet Take  400 mg by mouth every 6 (six) hours as needed for moderate pain (pain).     [provider]  ?ibuprofen (ADVIL,MOTRIN) 800 MG tablet Take 1 tablet (800 mg total) by mouth every 8 (eight) hours as needed for mild pain or moderate pain. ?Patient not taking: Reported on 01/05/2015 01/14/14   Clayton Bibles, PA-C  ?lisinopril (ZESTRIL) 10 MG tablet Take 10 mg by mouth daily. 10/21/21   [provider]  ?lisinopril (ZESTRIL) 20 MG tablet Take 20 mg by mouth daily. 02/04/22   [provider]  ?OIL OF OREGANO PO Take 1 capsule by mouth daily.    [provider]  ?predniSONE (DELTASONE) 20 MG tablet Take 2 tablets daily for 5 days, then take 1 tablet daily for 5 days 02/28/22   Roemhildt, Lorin T, PA-C  ?   ? ?Allergies    ?Benadryl [diphenhydramine hcl]   ? ?Review of Systems   ?Review of Systems  ?Constitutional:  Negative for fever.  ?Gastrointestinal:  Positive for abdominal pain.  ? ?Physical Exam ?Updated Vital Signs ?BP (!) 161/73   Pulse 72   Temp 97.8 ?F (36.6 ?C) (Oral)   Resp 16   LMP  (LMP Unknown)   SpO2 100%  ?Physical Exam ?Vitals and nursing note reviewed.  ?Constitutional:   ?   Appearance: She is well-developed. She is ill-appearing.  ?HENT:  ?   Head: Normocephalic and atraumatic.  ?   Right Ear: External ear  normal.  ?   Left Ear: External ear normal.  ?Eyes:  ?   General: No scleral icterus.    ?   Right eye: No discharge.     ?   Left eye: No discharge.  ?   Conjunctiva/sclera: Conjunctivae normal.  ?Neck:  ?   Trachea: No tracheal deviation.  ?Cardiovascular:  ?   Rate and Rhythm: Normal rate and regular rhythm.  ?Pulmonary:  ?   Effort: Pulmonary effort is normal. No respiratory distress.  ?   Breath sounds: Normal breath sounds. No stridor. No wheezing or rales.  ?Abdominal:  ?   General: Bowel sounds are normal. There is no distension.  ?   Palpations: Abdomen is soft.  ?   Tenderness: There is abdominal tenderness in the epigastric area. There is guarding. There is  no rebound.  ?Musculoskeletal:     ?   General: No tenderness or deformity.  ?   Cervical back: Neck supple.  ?Skin: ?   General: Skin is warm and dry.  ?   Findings: No rash.  ?Neurological:  ?   General: No focal deficit present.  ?   Mental Status: She is alert.  ?   Cranial Nerves: No cranial nerve deficit (no facial droop, extraocular movements intact, no slurred speech).  ?   Sensory: No sensory deficit.  ?   Motor: No abnormal muscle tone or seizure activity.  ?   Coordination: Coordination normal.  ?Psychiatric:     ?   Mood and Affect: Mood normal.  ? ? ?ED Results / Procedures / Treatments   ?Labs ?(all labs ordered are listed, but only abnormal results are displayed) ?Labs Reviewed  ?LIPASE, BLOOD - Abnormal; Notable for the following components:  ?    Result Value  ? Lipase 187 (*)   ? All other components within normal limits  ?COMPREHENSIVE METABOLIC PANEL - Abnormal; Notable for the following components:  ? Sodium 131 (*)   ? Chloride 89 (*)   ? Glucose, Bld 344 (*)   ? All other components within normal limits  ?CBC - Abnormal; Notable for the following components:  ? WBC 10.9 (*)   ? Hemoglobin 15.1 (*)   ? All other components within normal limits  ?URINALYSIS, ROUTINE W REFLEX MICROSCOPIC - Abnormal; Notable for the following components:  ? Glucose, UA >=500 (*)   ? Ketones, ur 20 (*)   ? All other components within normal limits  ?I-STAT BETA HCG BLOOD, ED ([facility], [facility], [facility] ONLY)  ? ? ?EKG ?None ? ?Radiology ?No results found. ? ?Procedures ?Procedures  ? ? ?Medications Ordered in ED ?Medications  ?sodium chloride 0.9 % bolus 1,000 mL (0 mLs Intravenous Stopped 03/07/22 1308)  ?  Followed by  ?0.9 %  sodium chloride infusion (has no administration in time range)  ?ondansetron (ZOFRAN) injection 4 mg (4 mg Intravenous Given 03/07/22 1139)  ?HYDROmorphone (DILAUDID) injection 1 mg (1 mg Intravenous Given 03/07/22 1141)  ? ? ?ED Course/ Medical Decision Making/ A&P ?Clinical Course as of 03/07/22 1325  ?Sat  Mar 07, 2022  ?1217 CBC(!) ?nl [JK]  ?1244 Lipase, blood(!) ?Lipase elevated at 187 [JK]  ?1244 Comprehensive metabolic panel(!) ?Hyperglycemia hyponatremia and hypochloremia noted [JK]  ?1244 I-Stat beta hCG blood, ED ?Negative [JK]  ?1315 Patient is feeling much better.  Pain significantly decreased.  She still has some mild discomfort but it is tolerable.  Patient was able to tolerate fluids without difficulty.  She feels ready  for discharge [JK]  ?  ?Clinical Course User Index ?[JK] Dorie Rank, MD  ? ?                        ?Medical Decision Making ?Amount and/or Complexity of Data Reviewed ?Labs: ordered. Decision-making details documented in ED Course. ? ?Risk ?Prescription drug management. ? ? ?Patient presented to the ER for evaluation of abdominal pain.  Has history of pancreatitis.  Was not aware of what triggered it initially.  Patient does admit to regular alcohol use.  Patient had tenderness palpation in the epigastric region.  No peritoneal signs.  Laboratory tests are consistent with pancreatitis.  Patient does have an elevated lipase.  She does have long electrolyte abnormalities.  Patient was treated with IV fluids antiemetics and pain medications.  Symptoms have significantly improved.  She has been able to tolerate fluids and feels ready for discharge.  We will give prescriptions for pain and nausea.  Discussed the importance of alcohol cessation ? ? ? ? ? ? ? ?Final Clinical Impression(s) / ED Diagnoses ?Final diagnoses:  ?Alcohol-induced acute pancreatitis, unspecified complication status  ? ? ?Rx / DC Orders ?ED Discharge Orders   ? ?      Ordered  ?  HYDROcodone-acetaminophen (NORCO/VICODIN) 5-325 MG tablet  Every 6 hours PRN       ? 03/07/22 1318  ?  ondansetron (ZOFRAN-ODT) 8 MG disintegrating tablet  Every 8 hours PRN       ? 03/07/22 1318  ? ?  ?  ? ?  ? ? ?  ?Dorie Rank, MD ?03/07/22 1325 ? ?

## 2022-03-07 NOTE — ED Notes (Signed)
Patient offered water for her to drink. She tolerated water without emesis or increased pain.  ?

## 2022-07-24 ENCOUNTER — Encounter (HOSPITAL_COMMUNITY): Payer: Self-pay | Admitting: Emergency Medicine

## 2022-07-24 ENCOUNTER — Emergency Department (HOSPITAL_COMMUNITY)
Admission: EM | Admit: 2022-07-24 | Discharge: 2022-07-24 | Disposition: A | Discharge status: Home / Self Care | Payer: BC Managed Care – PPO | Attending: Emergency Medicine | Admitting: Emergency Medicine

## 2022-07-24 DIAGNOSIS — Z20822 Contact with and (suspected) exposure to covid-19: Secondary | ICD-10-CM | POA: Insufficient documentation

## 2022-07-24 DIAGNOSIS — E1369 Other specified diabetes mellitus with other specified complication: Secondary | ICD-10-CM | POA: Insufficient documentation

## 2022-07-24 DIAGNOSIS — N179 Acute kidney failure, unspecified: Secondary | ICD-10-CM | POA: Insufficient documentation

## 2022-07-24 DIAGNOSIS — Z7984 Long term (current) use of oral hypoglycemic drugs: Secondary | ICD-10-CM | POA: Diagnosis not present

## 2022-07-24 DIAGNOSIS — E860 Dehydration: Secondary | ICD-10-CM | POA: Diagnosis not present

## 2022-07-24 DIAGNOSIS — R42 Dizziness and giddiness: Secondary | ICD-10-CM

## 2022-07-24 LAB — BASIC METABOLIC PANEL
Anion gap: 12 (ref 5–15)
BUN: 36 mg/dL — ABNORMAL HIGH (ref 6–20)
CO2: 21 mmol/L — ABNORMAL LOW (ref 22–32)
Calcium: 9.5 mg/dL (ref 8.9–10.3)
Chloride: 105 mmol/L (ref 98–111)
Creatinine, Ser: 1.19 mg/dL — ABNORMAL HIGH (ref 0.44–1.00)
GFR, Estimated: 56 mL/min — ABNORMAL LOW (ref >60)
Glucose, Bld: 100 mg/dL — ABNORMAL HIGH (ref 70–99)
Potassium: 4.4 mmol/L (ref 3.5–5.1)
Sodium: 138 mmol/L (ref 135–145)

## 2022-07-24 LAB — CBC
HCT: 48.2 % — ABNORMAL HIGH (ref 36.0–46.0)
Hemoglobin: 16.5 g/dL — ABNORMAL HIGH (ref 12.0–15.0)
MCH: 30.2 pg (ref 26.0–34.0)
MCHC: 34.2 g/dL (ref 30.0–36.0)
MCV: 88.3 fL (ref 80.0–100.0)
Platelets: 301 10*3/uL (ref 150–400)
RBC: 5.46 MIL/uL — ABNORMAL HIGH (ref 3.87–5.11)
RDW: 12.8 % (ref 11.5–15.5)
WBC: 8.1 10*3/uL (ref 4.0–10.5)
nRBC: 0 % (ref 0.0–0.2)

## 2022-07-24 LAB — I-STAT BETA HCG BLOOD, ED (MC, WL, AP ONLY): I-stat hCG, quantitative: <5 m[IU]/mL (ref <5)

## 2022-07-24 LAB — URINALYSIS, ROUTINE W REFLEX MICROSCOPIC
Bilirubin Urine: NEGATIVE
Glucose, UA: >=500 mg/dL — AB
Ketones, ur: 20 mg/dL — AB
Leukocytes,Ua: NEGATIVE
Nitrite: NEGATIVE
Protein, ur: 30 mg/dL — AB
Specific Gravity, Urine: 1.023 (ref 1.005–1.030)
pH: 5 (ref 5.0–8.0)

## 2022-07-24 LAB — RESP PANEL BY RT-PCR (FLU A&B, COVID) ARPGX2
Influenza A by PCR: NEGATIVE
Influenza B by PCR: NEGATIVE
SARS Coronavirus 2 by RT PCR: NEGATIVE

## 2022-07-24 LAB — CBG MONITORING, ED: Glucose-Capillary: 96 mg/dL (ref 70–99)

## 2022-07-24 MED ORDER — SODIUM CHLORIDE 0.9 % IV BOLUS
1000.0000 mL | Freq: Once | INTRAVENOUS | Status: AC
  Administered 2022-07-24: 1000 mL via INTRAVENOUS

## 2022-07-24 MED ORDER — MECLIZINE HCL 25 MG PO TABS
25.0000 mg | ORAL_TABLET | Freq: Once | ORAL | Status: AC
  Administered 2022-07-24: 25 mg via ORAL
  Filled 2022-07-24 (×2): qty 1

## 2022-07-24 NOTE — ED Triage Notes (Signed)
Patient complains of dizziness that started yesterday, no unilateral weakness. No aphasia, reports intermittently "wavy" vision this morning. Patient is alert, oriented, and in no apparent distress at this time.

## 2022-07-24 NOTE — ED Provider Triage Note (Signed)
Emergency Medicine Provider Triage Evaluation Note  Melissa Lara , a 51 y.o. female  was evaluated in triage.  Pt complains of dizziness, and generalized weakness.  Patient states that symptoms began abruptly yesterday morning.  She states that she was going to the bathroom when she stood up and felt "dizzy and lightheaded".  She felt like she was going to fall but did not fall.  She notes repeated episodes that seem to be positional in nature when she gets up from a seated position.  She also notes some visual disturbance where her vision "feels wavy."  She does not feel like the room is spinning but that it feels "wobbly"  when these episodes occur.  She returns to baseline in between episodes.  Denies fever, chills, night sweats, chest pain, shortness of breath, abdominal pain, nausea, vomiting, urinary symptoms, change in bowel habits.  Denies blurred vision, unilateral weakness/numbness, gait abnormalities, slurred speech, facial droop.  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  Review of Systems  Positive: See above Negative:   Physical Exam  BP 97/62 (BP Location: Left Arm)   Pulse 95   Temp 98.2 F (36.8 C) (Oral)   Resp 16   SpO2 100%  Gen:   Awake, no distress   Resp:  Normal effort  MSK:   Moves extremities without difficulty  Other:  Lungs clear to auscultation.  Regular rate and rhythm with no obvious murmurs gallops or rubs.  No abdominal tenderness.  Patient has mild horizontal nystagmus to the right with EOMs.  Otherwise, cranial nerves III through XII grossly intact with no abnormalities.  Medical Decision Making  Medically screening exam initiated at 1:48 PM.  Appropriate orders placed.  Darleene Cleaver was informed that the remainder of the evaluation will be completed by another provider, this initial triage assessment does not replace that evaluation, and the importance of remaining in the ED until their  evaluation is complete.     Peter Garter, [place] 07/24/22 1350

## 2022-07-24 NOTE — ED Provider Notes (Signed)
[facility] EMERGENCY DEPARTMENT Provider Note   CSN: [id number] Arrival date & time: 07/24/22  1249     History  Chief Complaint  Patient presents with   Dizziness    Melissa Lara is a 51 y.o. female.  Patient is a 51 year old female presenting for dizziness.  Patient admits to dizziness described as lightheadedness upon standing.  Denies any syncopal episodes.  Admits to decreased oral intake secondary to nausea.  Admits to frequent bouts of pancreatitis.  Denies any abdominal pain at this time.  Patient is a type II diabetic recently started on Jardiance.  Admits to polydipsia and polyuria.  Pressure on arrival eval 95/60.  Otherwise denies any fevers chills, vomiting, or diarrhea.  Denies any active bleeding.  The history is provided by the patient. No language interpreter was used.  Dizziness Associated symptoms: nausea   Associated symptoms: no chest pain, no palpitations, no shortness of breath and no vomiting        Home Medications Prior to Admission medications   Medication Sig Start Date End Date Taking? Authorizing Provider  cetirizine (ZYRTEC ALLERGY) 10 MG tablet Take 1 tablet (10 mg total) by mouth daily. 02/09/22   Henderly, Britni A, PA-C  diazepam (VALIUM) 5 MG tablet Take 1 tablet (5 mg total) by mouth every 8 (eight) hours as needed (muscle spasm or pain). Patient not taking: Reported on 01/05/2015 01/14/14   Trixie Dredge, PA-C  FARXIGA 10 MG TABS tablet Take 10 mg by mouth daily. 12/29/21   [provider]  fluticasone (FLONASE) 50 MCG/ACT nasal spray Place 2 sprays into both nostrils daily. 02/09/22   Henderly, Britni A, PA-C  hydrochlorothiazide (HYDRODIURIL) 12.5 MG tablet Take 12.5 mg by mouth daily. 02/07/22   [provider]  HYDROcodone-acetaminophen (NORCO/VICODIN) 5-325 MG tablet Take 1 tablet by mouth every 6 (six) hours as needed. 03/07/22   Linwood Dibbles, MD  ibuprofen (ADVIL,MOTRIN) 200 MG tablet Take 400 mg by  mouth every 6 (six) hours as needed for moderate pain (pain).     [provider]  ibuprofen (ADVIL,MOTRIN) 800 MG tablet Take 1 tablet (800 mg total) by mouth every 8 (eight) hours as needed for mild pain or moderate pain. Patient not taking: Reported on 01/05/2015 01/14/14   Trixie Dredge, PA-C  lisinopril (ZESTRIL) 10 MG tablet Take 10 mg by mouth daily. 10/21/21   [provider]  lisinopril (ZESTRIL) 20 MG tablet Take 20 mg by mouth daily. 02/04/22   [provider]  OIL OF OREGANO PO Take 1 capsule by mouth daily.    [provider]  ondansetron (ZOFRAN-ODT) 8 MG disintegrating tablet Take 1 tablet (8 mg total) by mouth every 8 (eight) hours as needed for nausea or vomiting. 08/01/22   Theron Arista, PA-C  predniSONE (DELTASONE) 20 MG tablet Take 2 tablets daily for 5 days, then take 1 tablet daily for 5 days 02/28/22   Roemhildt, Lorin T, PA-C      Allergies    Benadryl [diphenhydramine hcl]    Review of Systems   Review of Systems  Constitutional:  Negative for chills and fever.  HENT:  Negative for ear pain and sore throat.   Eyes:  Negative for pain and visual disturbance.  Respiratory:  Negative for cough and shortness of breath.   Cardiovascular:  Negative for chest pain and palpitations.  Gastrointestinal:  Positive for nausea. Negative for abdominal pain and vomiting.  Genitourinary:  Negative for dysuria and hematuria.  Musculoskeletal:  Negative for arthralgias and back pain.  Skin:  Negative for color change and rash.  Neurological:  Positive for dizziness and light-headedness. Negative for seizures and syncope.  All other systems reviewed and are negative.   Physical Exam Updated Vital Signs BP 137/80   Pulse (!) 104   Temp 98 F (36.7 C) (Oral)   Resp 18   SpO2 100%  Physical Exam Vitals and nursing note reviewed.  Constitutional:      General: She is not in acute distress.    Appearance: She is well-developed.  HENT:     Head:  Normocephalic and atraumatic.  Eyes:     Conjunctiva/sclera: Conjunctivae normal.  Cardiovascular:     Rate and Rhythm: Normal rate and regular rhythm.     Heart sounds: No murmur heard. Pulmonary:     Effort: Pulmonary effort is normal. No respiratory distress.     Breath sounds: Normal breath sounds.  Abdominal:     Palpations: Abdomen is soft.     Tenderness: There is no abdominal tenderness.  Musculoskeletal:        General: No swelling.     Cervical back: Neck supple.  Skin:    General: Skin is warm and dry.     Capillary Refill: Capillary refill takes less than 2 seconds.     Findings: No rash.  Neurological:     Mental Status: She is alert.     GCS: GCS eye subscore is 4. GCS verbal subscore is 5. GCS motor subscore is 6.  Psychiatric:        Mood and Affect: Mood normal.     ED Results / Procedures / Treatments   Labs (all labs ordered are listed, but only abnormal results are displayed) Labs Reviewed  BASIC METABOLIC PANEL - Abnormal; Notable for the following components:      Result Value   CO2 21 (*)    Glucose, Bld 100 (*)    BUN 36 (*)    Creatinine, Ser 1.19 (*)    GFR, Estimated 56 (*)    All other components within normal limits  CBC - Abnormal; Notable for the following components:   RBC 5.46 (*)    Hemoglobin 16.5 (*)    HCT 48.2 (*)    All other components within normal limits  URINALYSIS, ROUTINE W REFLEX MICROSCOPIC - Abnormal; Notable for the following components:   APPearance CLOUDY (*)    Glucose, UA >=500 (*)    Hgb urine dipstick SMALL (*)    Ketones, ur 20 (*)    Protein, ur 30 (*)    Bacteria, UA FEW (*)    All other components within normal limits  RESP PANEL BY RT-PCR (FLU A&B, COVID) ARPGX2  CBG MONITORING, ED  I-STAT BETA HCG BLOOD, ED ([facility], [facility], [facility] ONLY)    EKG EKG Interpretation  Date/Time:  Friday July 24 2022 13:11:20 EDT Ventricular Rate:  84 PR Interval:  138 QRS Duration: 86 QT Interval:  386 QTC  Calculation: 456 R Axis:   73 Text Interpretation: Normal sinus rhythm Normal ECG When compared with ECG of 22-Aug-2012 06:12, PREVIOUS ECG IS PRESENT Confirmed by Edwin Dada ([id number]) on 07/24/2022 5:33:08 PM  Radiology No results found.  Procedures Procedures    Medications Ordered in ED Medications  meclizine (ANTIVERT) tablet 25 mg (25 mg Oral Given 07/24/22 1412)  sodium chloride 0.9 % bolus 1,000 mL (0 mLs Intravenous Stopped 07/24/22 2057)    ED Course/ Medical Decision Making/ A&P  Medical Decision Making Amount and/or Complexity of Data Reviewed Labs: ordered.   66:79 AM  51 year old female presenting for lightheadedness and presyncopal symptoms.  Patient is alert oriented x3, no acute distress, afebrile, stable vital signs.  Blood pressure on arrival 95/60.  Positive for type 2 diabetes.  Patient admits to polydipsia and polyuria.  Patient explains symptoms as orthostatic and occurring only when standing.  Soft blood pressures, elevated hemoglobin, and AKI is likely secondary to dehydration.  IV fluids started. Pt hyperglycemic without DKA.  Otherwise, EKG is stable normal sinus rhythm.  No ST segment elevation or depression.  Stable intervals.  No respiratory distress or hypoxia.  No hypoglycemia.  No anemia. Patient is otherwise well-appearing with no signs or symptoms of sepsis at this time.   History of pancreatitis-Abdomen is soft and nontender. Doubt at this time.  On re-evaluation, patient admits to resolution of symtpoms. BP improved to 137/80. Pt's admits to increased oral intake. Dehydration likely secondary to uncontrolled sugars. Recommended for low sugar diet including decreasing white carbs/breads, processed foods, pops, and juices. Then f/u with pcp.  Patient in no distress and overall condition improved here in the ED. Detailed discussions were had with the patient regarding current findings, and need for close f/u with PCP or on call doctor.  The patient has been instructed to return immediately if the symptoms worsen in any way for re-evaluation. Patient verbalized understanding and is in agreement with current care plan. All questions answered prior to discharge.           Final Clinical Impression(s) / ED Diagnoses Final diagnoses:  AKI (acute kidney injury) (HCC)  Dehydration  Lightheadedness  Other specified diabetes mellitus with other specified complication, unspecified whether long term insulin use [facility])    Rx / DC Orders ED Discharge Orders     None         Franne Forts, DO 08/04/22 0867

## 2022-07-24 NOTE — ED Notes (Signed)
The pt reports that she has dizziness only when she  stands

## 2022-08-01 ENCOUNTER — Emergency Department (HOSPITAL_COMMUNITY)
Admission: EM | Admit: 2022-08-01 | Discharge: 2022-08-01 | Disposition: A | Discharge status: Home / Self Care | Payer: BC Managed Care – PPO | Attending: Emergency Medicine | Admitting: Emergency Medicine

## 2022-08-01 ENCOUNTER — Other Ambulatory Visit: Payer: Self-pay

## 2022-08-01 ENCOUNTER — Encounter (HOSPITAL_COMMUNITY): Payer: Self-pay

## 2022-08-01 DIAGNOSIS — R1010 Upper abdominal pain, unspecified: Secondary | ICD-10-CM | POA: Insufficient documentation

## 2022-08-01 DIAGNOSIS — R112 Nausea with vomiting, unspecified: Secondary | ICD-10-CM | POA: Insufficient documentation

## 2022-08-01 DIAGNOSIS — J45909 Unspecified asthma, uncomplicated: Secondary | ICD-10-CM | POA: Diagnosis not present

## 2022-08-01 DIAGNOSIS — I10 Essential (primary) hypertension: Secondary | ICD-10-CM | POA: Insufficient documentation

## 2022-08-01 DIAGNOSIS — E109 Type 1 diabetes mellitus without complications: Secondary | ICD-10-CM | POA: Diagnosis not present

## 2022-08-01 HISTORY — DX: Other intervertebral disc degeneration, lumbar region without mention of lumbar back pain or lower extremity pain: M51.369

## 2022-08-01 HISTORY — DX: Essential (primary) hypertension: I10

## 2022-08-01 HISTORY — DX: Acute pancreatitis without necrosis or infection, unspecified: K85.90

## 2022-08-01 LAB — CBC WITH DIFFERENTIAL/PLATELET
Abs Immature Granulocytes: 0.02 10*3/uL (ref 0.00–0.07)
Basophils Absolute: 0.1 10*3/uL (ref 0.0–0.1)
Basophils Relative: 1 %
Eosinophils Absolute: 0.1 10*3/uL (ref 0.0–0.5)
Eosinophils Relative: 1 %
HCT: 47.3 % — ABNORMAL HIGH (ref 36.0–46.0)
Hemoglobin: 16.3 g/dL — ABNORMAL HIGH (ref 12.0–15.0)
Immature Granulocytes: 0 %
Lymphocytes Relative: 21 %
Lymphs Abs: 1.9 10*3/uL (ref 0.7–4.0)
MCH: 30 pg (ref 26.0–34.0)
MCHC: 34.5 g/dL (ref 30.0–36.0)
MCV: 86.9 fL (ref 80.0–100.0)
Monocytes Absolute: 0.4 10*3/uL (ref 0.1–1.0)
Monocytes Relative: 4 %
Neutro Abs: 6.4 10*3/uL (ref 1.7–7.7)
Neutrophils Relative %: 73 %
Platelets: 251 10*3/uL (ref 150–400)
RBC: 5.44 MIL/uL — ABNORMAL HIGH (ref 3.87–5.11)
RDW: 12.8 % (ref 11.5–15.5)
WBC: 8.8 10*3/uL (ref 4.0–10.5)
nRBC: 0 % (ref 0.0–0.2)

## 2022-08-01 LAB — COMPREHENSIVE METABOLIC PANEL
ALT: 18 U/L (ref 0–44)
AST: 13 U/L — ABNORMAL LOW (ref 15–41)
Albumin: 4.7 g/dL (ref 3.5–5.0)
Alkaline Phosphatase: 58 U/L (ref 38–126)
Anion gap: 12 (ref 5–15)
BUN: 32 mg/dL — ABNORMAL HIGH (ref 6–20)
CO2: 22 mmol/L (ref 22–32)
Calcium: 9.9 mg/dL (ref 8.9–10.3)
Chloride: 104 mmol/L (ref 98–111)
Creatinine, Ser: 1.2 mg/dL — ABNORMAL HIGH (ref 0.44–1.00)
GFR, Estimated: 55 mL/min — ABNORMAL LOW (ref >60)
Glucose, Bld: 211 mg/dL — ABNORMAL HIGH (ref 70–99)
Potassium: 3.7 mmol/L (ref 3.5–5.1)
Sodium: 138 mmol/L (ref 135–145)
Total Bilirubin: 0.3 mg/dL (ref 0.3–1.2)
Total Protein: 7.8 g/dL (ref 6.5–8.1)

## 2022-08-01 LAB — LIPASE, BLOOD: Lipase: 29 U/L (ref 11–51)

## 2022-08-01 LAB — I-STAT BETA HCG BLOOD, ED (MC, WL, AP ONLY): I-stat hCG, quantitative: <5 m[IU]/mL (ref <5)

## 2022-08-01 LAB — CBG MONITORING, ED: Glucose-Capillary: 198 mg/dL — ABNORMAL HIGH (ref 70–99)

## 2022-08-01 MED ORDER — ONDANSETRON 8 MG PO TBDP: 8.0000 mg | ORAL_TABLET | Freq: Three times a day (TID) | ORAL | 0 refills | Status: AC | PRN

## 2022-08-01 MED ORDER — LACTATED RINGERS IV BOLUS
1000.0000 mL | Freq: Once | INTRAVENOUS | Status: AC
  Administered 2022-08-01: 1000 mL via INTRAVENOUS

## 2022-08-01 MED ORDER — ONDANSETRON HCL 4 MG/2ML IJ SOLN
4.0000 mg | Freq: Once | INTRAMUSCULAR | Status: AC
  Administered 2022-08-01: 4 mg via INTRAVENOUS
  Filled 2022-08-01: qty 2

## 2022-08-01 NOTE — ED Provider Notes (Signed)
[facility]-EMERGENCY DEPT Provider Note   CSN: [id number] Arrival date & time: 08/01/22  1025     History  Chief Complaint  Patient presents with   Hematemesis    Melissa Lara is a 51 y.o. female.  HPI  Patient with medical history of asthma, bipolar 1, type 2 diabetes not on insulin, hypertension, history of alcohol induced pancreatitis presents today due to nausea and vomiting x2 days.  Patient had episode like this last week, she felt better after getting fluids in the hospital and some Zofran.  Starting 2 days ago out of nowhere she is not able to tolerate anything by mouth, she has some mild upper abdominal pain which comes and goes and is worse whenever she is vomiting.  Denies any chest pain or shortness of breath.  Earlier today at work she had streaks of hematemesis in her vomit which prompted today's ED visit.  She not having any chest pain or shortness of breath, denies any recent viral illness.  She is not on any blood thinners.  She is not drinking alcohol since April when she had alcohol induced pancreatitis.  She is currently on glipizide for diabetes and has not had [place] in a week.  Home Medications Prior to Admission medications   Medication Sig Start Date End Date Taking? Authorizing Provider  ondansetron (ZOFRAN-ODT) 8 MG disintegrating tablet Take 1 tablet (8 mg total) by mouth every 8 (eight) hours as needed for nausea or vomiting. 08/01/22  Yes Theron Arista, PA-C  cetirizine (ZYRTEC ALLERGY) 10 MG tablet Take 1 tablet (10 mg total) by mouth daily. 02/09/22   Henderly, Britni A, PA-C  diazepam (VALIUM) 5 MG tablet Take 1 tablet (5 mg total) by mouth every 8 (eight) hours as needed (muscle spasm or pain). Patient not taking: Reported on 01/05/2015 01/14/14   Trixie Dredge, PA-C  FARXIGA 10 MG TABS tablet Take 10 mg by mouth daily. 12/29/21   [provider]  fluticasone (FLONASE) 50 MCG/ACT nasal spray Place 2 sprays into both  nostrils daily. 02/09/22   Henderly, Britni A, PA-C  hydrochlorothiazide (HYDRODIURIL) 12.5 MG tablet Take 12.5 mg by mouth daily. 02/07/22   [provider]  HYDROcodone-acetaminophen (NORCO/VICODIN) 5-325 MG tablet Take 1 tablet by mouth every 6 (six) hours as needed. 03/07/22   Linwood Dibbles, MD  ibuprofen (ADVIL,MOTRIN) 200 MG tablet Take 400 mg by mouth every 6 (six) hours as needed for moderate pain (pain).     [provider]  ibuprofen (ADVIL,MOTRIN) 800 MG tablet Take 1 tablet (800 mg total) by mouth every 8 (eight) hours as needed for mild pain or moderate pain. Patient not taking: Reported on 01/05/2015 01/14/14   Trixie Dredge, PA-C  lisinopril (ZESTRIL) 10 MG tablet Take 10 mg by mouth daily. 10/21/21   [provider]  lisinopril (ZESTRIL) 20 MG tablet Take 20 mg by mouth daily. 02/04/22   [provider]  OIL OF OREGANO PO Take 1 capsule by mouth daily.    [provider]  predniSONE (DELTASONE) 20 MG tablet Take 2 tablets daily for 5 days, then take 1 tablet daily for 5 days 02/28/22   Roemhildt, Lorin T, PA-C      Allergies    Benadryl [diphenhydramine hcl]    Review of Systems   Review of Systems  Physical Exam Updated Vital Signs BP (!) 157/90   Pulse 84   Temp 98.1 F (36.7 C) (Oral)   Resp 16   Ht 5'  5" (1.651 m)   Wt 63.5 kg   SpO2 99%   BMI 23.30 kg/m  Physical Exam Vitals and nursing note reviewed. Exam conducted with a chaperone present.  Constitutional:      Appearance: Normal appearance.  HENT:     Head: Normocephalic and atraumatic.     Mouth/Throat:     Mouth: Mucous membranes are dry.  Eyes:     General: No scleral icterus.       Right eye: No discharge.        Left eye: No discharge.     Extraocular Movements: Extraocular movements intact.     Pupils: Pupils are equal, round, and reactive to light.  Cardiovascular:     Rate and Rhythm: Normal rate and regular rhythm.     Pulses: Normal pulses.     Heart  sounds: Normal heart sounds. No murmur heard.    No friction rub. No gallop.  Pulmonary:     Effort: Pulmonary effort is normal. No respiratory distress.     Breath sounds: Normal breath sounds.     Comments: Lungs are clear to auscultation bilaterally Abdominal:     General: Abdomen is flat. Bowel sounds are normal. There is no distension.     Palpations: Abdomen is soft.     Tenderness: There is no abdominal tenderness.     Comments: Abdomen is soft, mild upper abdominal tenderness without rigidity or guarding  Skin:    General: Skin is warm and dry.     Coloration: Skin is not jaundiced.  Neurological:     Mental Status: She is alert. Mental status is at baseline.     Coordination: Coordination normal.     ED Results / Procedures / Treatments   Labs (all labs ordered are listed, but only abnormal results are displayed) Labs Reviewed  CBC WITH DIFFERENTIAL/PLATELET - Abnormal; Notable for the following components:      Result Value   RBC 5.44 (*)    Hemoglobin 16.3 (*)    HCT 47.3 (*)    All other components within normal limits  COMPREHENSIVE METABOLIC PANEL - Abnormal; Notable for the following components:   Glucose, Bld 211 (*)    BUN 32 (*)    Creatinine, Ser 1.20 (*)    AST 13 (*)    GFR, Estimated 55 (*)    All other components within normal limits  CBG MONITORING, ED - Abnormal; Notable for the following components:   Glucose-Capillary 198 (*)    All other components within normal limits  LIPASE, BLOOD  URINALYSIS, ROUTINE W REFLEX MICROSCOPIC  I-STAT BETA HCG BLOOD, ED ([facility], [facility], [facility] ONLY)    EKG None  Radiology No results found.  Procedures Procedures    Medications Ordered in ED Medications  lactated ringers bolus 1,000 mL (0 mLs Intravenous Stopped 08/01/22 1252)  ondansetron (ZOFRAN) injection 4 mg (4 mg Intravenous Given 08/01/22 1051)    ED Course/ Medical Decision Making/ A&P                           Medical Decision Making Amount and/or  Complexity of Data Reviewed Labs: ordered.  Risk Prescription drug management.   Presents for nausea and vomiting.  There also streaks of blood in her vomit.  Differential includes not limited to AKI, dehydration, gastroparesis, DKA, pancreatitis.    Abdominal exam is relatively benign although there is some mild epigastric tenderness there is no rigidity or guarding.  Lungs are clear to auscultation bilaterally, not tachycardic or febrile.  Does not appear septic. -BP (!) 148/75   Pulse 82   Temp 98.1 F (36.7 C) (Oral)   Resp 18   Ht 5\' 5"  (1.651 m)   Wt 63.5 kg   SpO2 100%   BMI 23.30 kg/m   Ordered, viewed and interpreted laboratory work-up. -CBC without leukocytosis, globin slightly elevated at 16.3 could be secondary to hemoconcentration. -CMP without gross electrolyte derangement.  Slight AKI with a creatinine of 1.2 not grossly unchanged from previous.  Glucose is mildly elevated at 211.  No signs of DKA. -Not pregnant, not an ectopic -Lipase is within normal limits, consistent pancreatitis  I reviewed patient's home medication list.  I ordered a liter of lactated Ringer's and 4 mg IV Zofran.  On reevaluation patient is feeling somewhat improved, her abdomen is soft nontender on repeat exam.  We will p.o. challenge.  Patient is tolerating p.o.  I think the hematemesis streaks are likely secondary to repeated emesis and a Mallory-Weiss tear.  Patient is stable for outpatient follow up with PCP at this time.         Final Clinical Impression(s) / ED Diagnoses Final diagnoses:  Nausea and vomiting, unspecified vomiting type    Rx / DC Orders ED Discharge Orders          Ordered    ondansetron (ZOFRAN-ODT) 8 MG disintegrating tablet  Every 8 hours PRN        08/01/22 1233              Sherrill Raring, PA-C 08/01/22 1540    Isla Pence, MD 08/01/22 1601

## 2022-08-01 NOTE — Discharge Instructions (Addendum)
You are seen today for nausea and vomiting.  Drink plenty of fluids, take the Zofran every 8 hours as needed for vomiting.  The blood in your vomit was likely a Mallory-Weiss tear from the vomiting, I would talk with your doctor about your diabetes medicine as the changes may be causing some symptoms.  Return to ED if unable to drink, the pain becomes severe or constant or you have new symptoms.

## 2022-08-01 NOTE — ED Triage Notes (Signed)
Patient reports that she has been vomiting x 2 days and this AM she noted streaks of bright red blood in the emesis.  Patient denies any abdominal pain.

## 2023-01-21 IMAGING — CT CT HEAD W/O CM
3 of 4 series · 14 of 47 positions shown, 16 images · non-contrast
Comparison: Head CT dated 01/05/2015.

CLINICAL DATA: Headache.



[Series 5: coronal soft tissue · coronal · 0.33mm/px · 3 of 69 slices shown]
[im 23/69  brain]
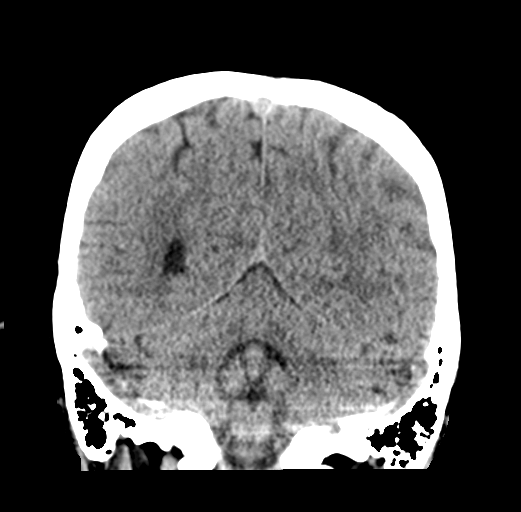
[im 31/69  brain]
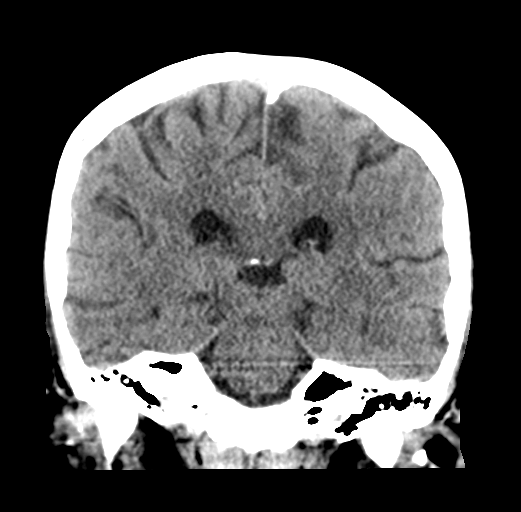
[im 38/69  brain]
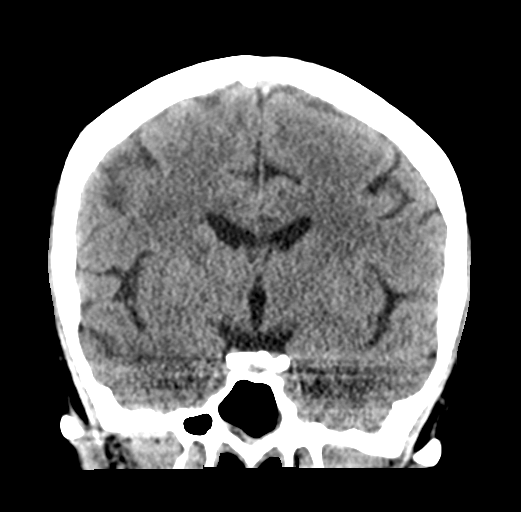

[Series 6: sagittal soft tissue · sagittal · 0.33mm/px · 3 of 57 slices shown]
[im 19/57  brain]
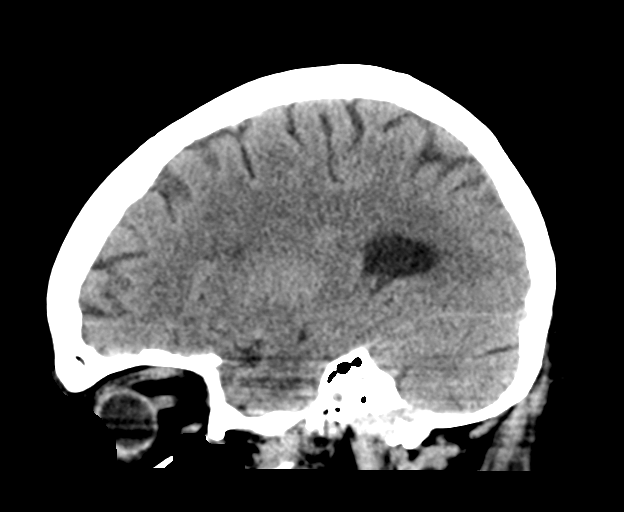
[im 29/57  brain]
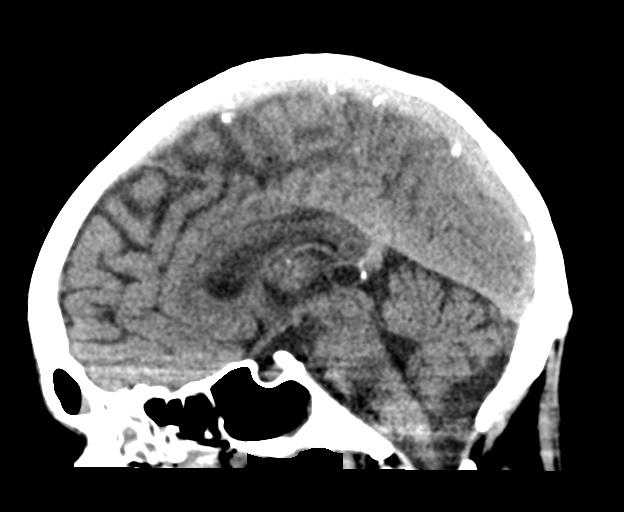
[im 38/57  brain]
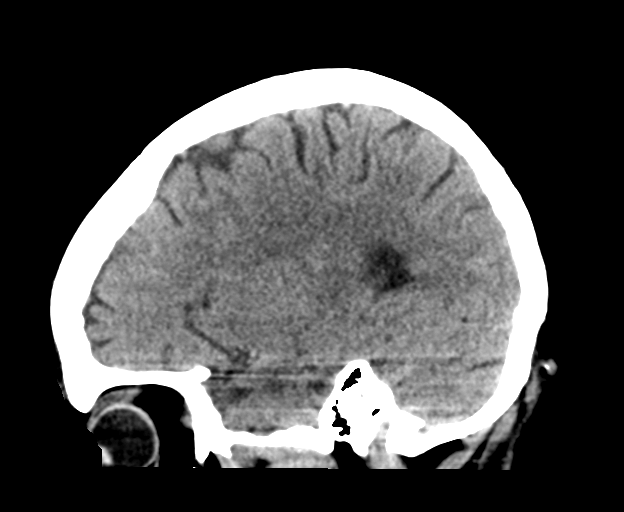

[Series 7: true axial · axial · 0.34mm/px · z∈[-132,-14]mm · 8 of 52 slices shown, 10 images]
[im 6/52  brain]
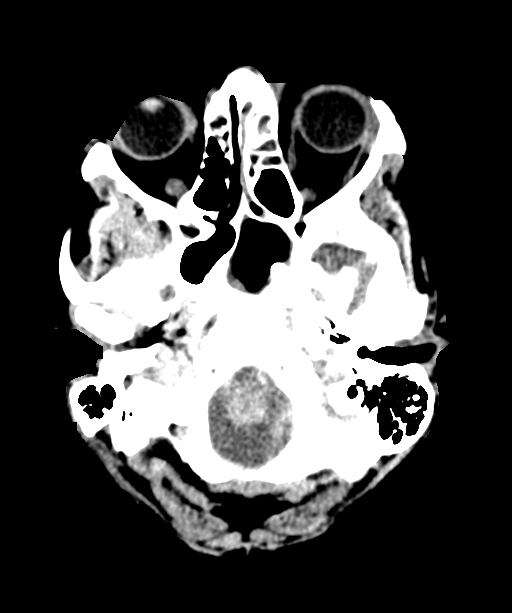
[im 6/52  bone]
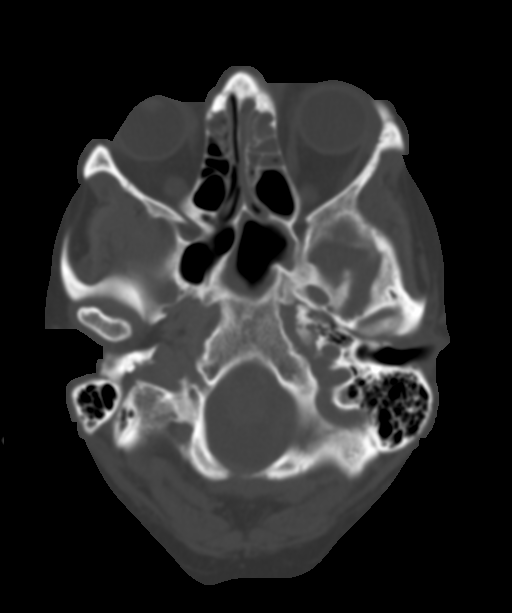
[im 12/52  brain]
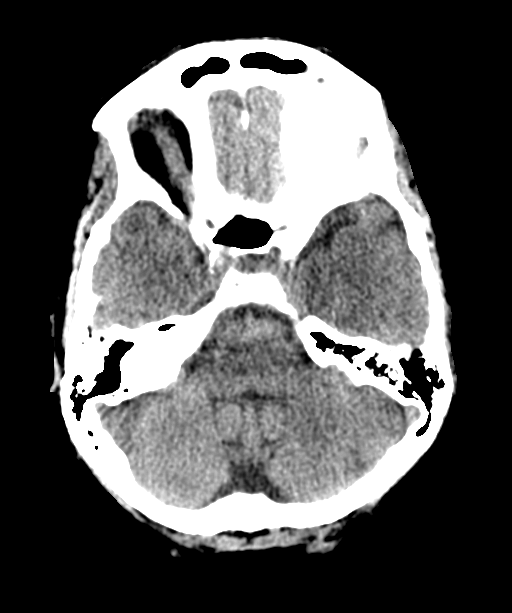
[im 18/52  brain]
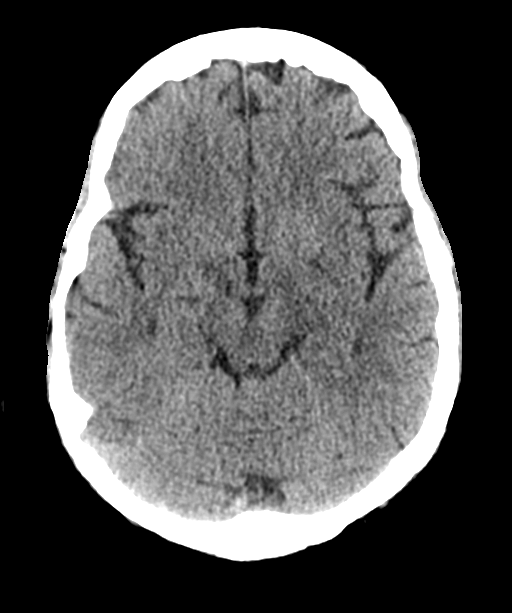
[im 23/52  brain]
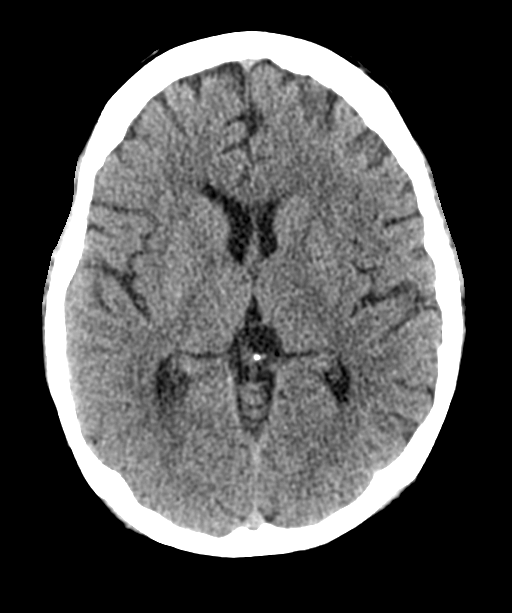
[im 29/52  brain]
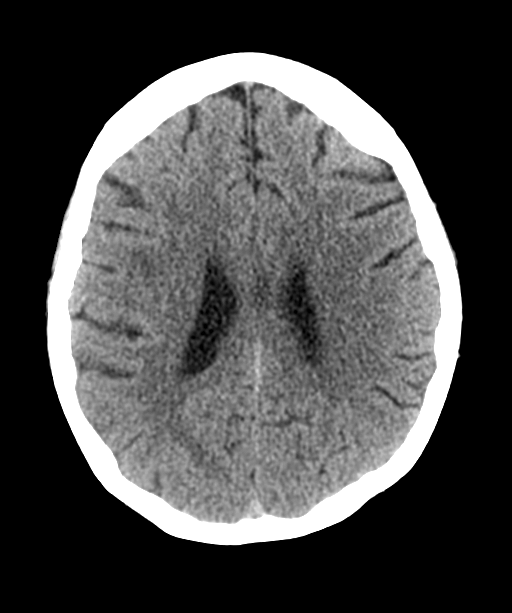
[im 29/52  bone]
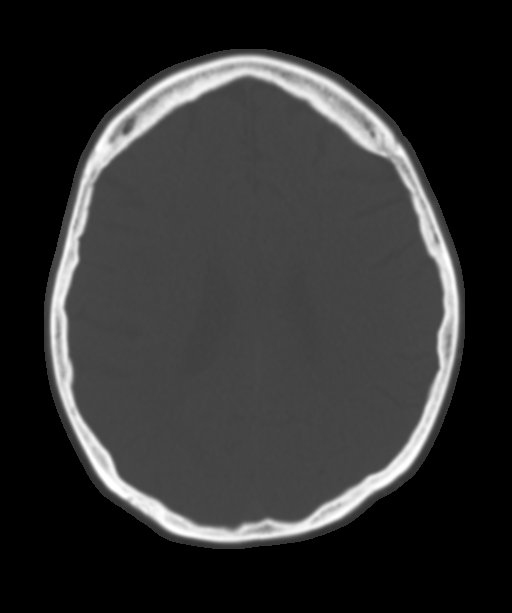
[im 35/52  brain]
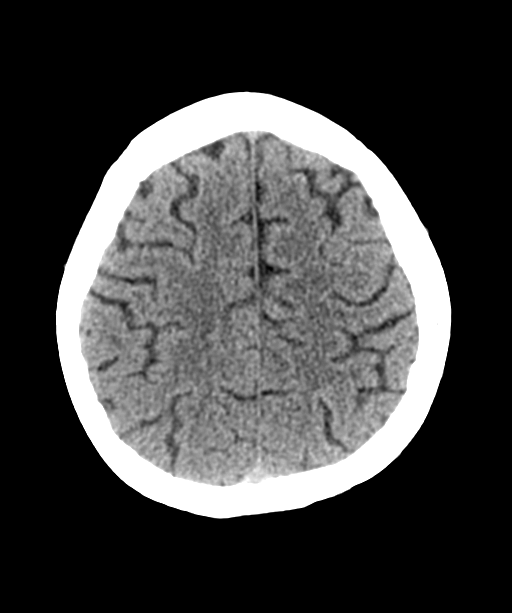
[im 40/52  brain]
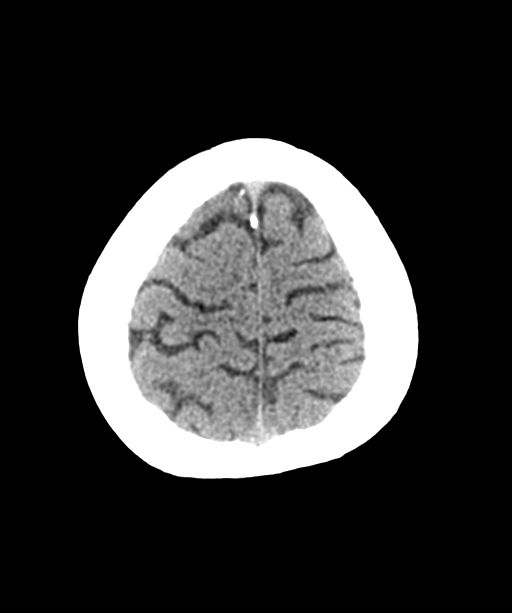
[im 46/52  brain]
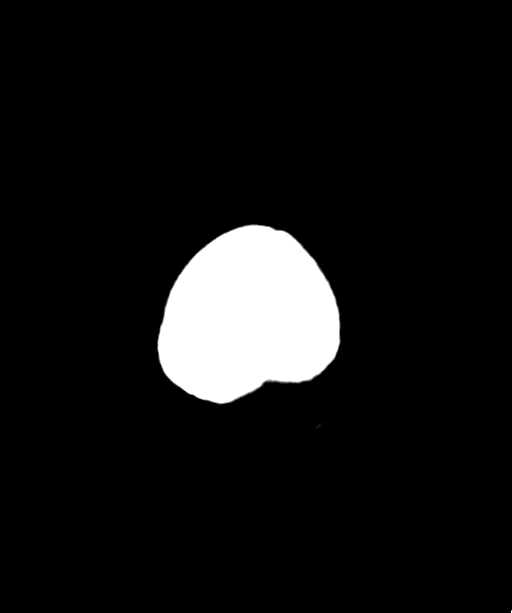

[14 of 47 positions shown; findings below may reference images not displayed]

FINDINGS: Brain: The ventricles and sulci are appropriate size for the
patient's age. The gray-white matter discrimination is preserved.
There is no acute intracranial hemorrhage. No mass effect or midline
shift. No extra-axial fluid collection.

Vascular: No hyperdense vessel or unexpected calcification.

Skull: Normal. Negative for fracture or focal lesion.

Sinuses/Orbits: Diffuse mucoperiosteal thickening of paranasal
sinuses. There is complete opacification of the visualized left
maxillary sinus and ethmoid air cells. The mastoid air cells are
clear.

Other: None
IMPRESSION: 1. No acute intracranial pathology.
2. Paranasal sinus disease.

## 2023-06-21 DIAGNOSIS — D513 Other dietary vitamin B12 deficiency anemia: Secondary | ICD-10-CM | POA: Diagnosis not present

## 2023-08-05 ENCOUNTER — Other Ambulatory Visit: Payer: Self-pay | Admitting: Family Medicine
